# Patient Record
Sex: Male | Born: 1960
Health system: Southern US, Community
[De-identification: ages and names within clinical notes are randomized; demographics above are authoritative.]

## PROBLEM LIST (undated history)

## (undated) DIAGNOSIS — Z91014 Allergy to mammalian meats: Secondary | ICD-10-CM

## (undated) DIAGNOSIS — I10 Essential (primary) hypertension: Secondary | ICD-10-CM

## (undated) DIAGNOSIS — E039 Hypothyroidism, unspecified: Secondary | ICD-10-CM

## (undated) DIAGNOSIS — E079 Disorder of thyroid, unspecified: Secondary | ICD-10-CM

## (undated) HISTORY — PX: HEMORRHOID SURGERY: SHX153

## (undated) HISTORY — PX: HERNIA REPAIR: SHX51

## (undated) HISTORY — PX: OTHER SURGICAL HISTORY: SHX169

## (undated) HISTORY — PX: ABDOMINAL HERNIA REPAIR: SHX539

---

## 2004-08-23 ENCOUNTER — Emergency Department: Payer: Self-pay | Admitting: Emergency Medicine

## 2004-09-10 ENCOUNTER — Ambulatory Visit: Payer: Self-pay | Admitting: Specialist

## 2007-01-23 ENCOUNTER — Ambulatory Visit: Payer: Self-pay | Admitting: Emergency Medicine

## 2007-11-03 ENCOUNTER — Ambulatory Visit: Payer: Self-pay | Admitting: Gastroenterology

## 2008-10-04 ENCOUNTER — Ambulatory Visit: Payer: Self-pay | Admitting: Internal Medicine

## 2008-10-05 ENCOUNTER — Ambulatory Visit: Payer: Self-pay | Admitting: Internal Medicine

## 2008-10-08 ENCOUNTER — Ambulatory Visit: Payer: Self-pay | Admitting: Internal Medicine

## 2009-12-12 ENCOUNTER — Ambulatory Visit: Payer: Self-pay | Admitting: Family Medicine

## 2010-10-16 ENCOUNTER — Ambulatory Visit: Payer: Self-pay | Admitting: Internal Medicine

## 2011-01-26 ENCOUNTER — Ambulatory Visit: Payer: Self-pay

## 2013-02-24 ENCOUNTER — Ambulatory Visit: Payer: Self-pay | Admitting: Medical

## 2013-02-27 LAB — BETA STREP CULTURE(ARMC)

## 2014-09-09 ENCOUNTER — Ambulatory Visit
Admission: EM | Admit: 2014-09-09 | Discharge: 2014-09-09 | Disposition: A | Discharge status: Home / Self Care | Payer: Managed Care, Other (non HMO) | Attending: Family Medicine | Admitting: Family Medicine

## 2014-09-09 ENCOUNTER — Encounter: Payer: Self-pay | Admitting: Family Medicine

## 2014-09-09 DIAGNOSIS — M62830 Muscle spasm of back: Secondary | ICD-10-CM

## 2014-09-09 DIAGNOSIS — Z8679 Personal history of other diseases of the circulatory system: Secondary | ICD-10-CM | POA: Diagnosis not present

## 2014-09-09 DIAGNOSIS — S39012D Strain of muscle, fascia and tendon of lower back, subsequent encounter: Secondary | ICD-10-CM | POA: Diagnosis not present

## 2014-09-09 HISTORY — DX: Disorder of thyroid, unspecified: E07.9

## 2014-09-09 HISTORY — DX: Essential (primary) hypertension: I10

## 2014-09-09 MED ORDER — MELOXICAM 7.5 MG PO TABS: 7.5000 mg | ORAL_TABLET | Freq: Two times a day (BID) | ORAL | Status: DC | PRN

## 2014-09-09 MED ORDER — ORPHENADRINE CITRATE ER 100 MG PO TB12: 100.0000 mg | ORAL_TABLET | Freq: Two times a day (BID) | ORAL | Status: DC | PRN

## 2014-09-09 NOTE — ED Provider Notes (Signed)
CSN: [id number]     Arrival date & time 09/09/14  1525 History   First MD Initiated Contact with Patient 09/09/14 1542     Chief Complaint  Patient presents with  . Back Pain   (Consider location/radiation/quality/duration/timing/severity/associated sxs/prior Treatment) Patient is a 54 y.o. male presenting with back pain. The history is provided by the patient. A language interpreter was used.  Back Pain Location:  Sacro-iliac joint and lumbar spine Quality:  Aching and cramping Radiates to:  Does not radiate Pain severity:  Moderate Pain is:  Same all the time Onset quality:  Sudden Timing:  Constant Progression:  Waxing and waning Chronicity:  Recurrent Context: physical stress and twisting   Relieved by:  Nothing Ineffective treatments:  OTC medications Associated symptoms: no leg pain, no numbness, no tingling and no weakness    he has a history of recurrent back pain. States that wife was loose in the house and his grandchildren encouraged him to try to kill it when he jumped until it he twists his back. Start having pain. Last night after someplace icy hot on his back form he told him he saw some bruising and repeated that this morning she came in to be seen.  Past Medical History  Diagnosis Date  . Thyroid disease   . Hypertension    Past Surgical History  Procedure Laterality Date  . Abdominal hernia repair      2006  . Hemmorhoid     Family History  Problem Relation Age of Onset  . Diabetes Mother   . Cancer Mother   . Cancer Father   . Thyroid disease Sister   . Cancer Sister   . Diabetes Brother   . Thyroid disease Brother    History  Substance Use Topics  . Smoking status: Former Games developermoker  . Smokeless tobacco: Not on file  . Alcohol Use: No    Review of Systems  Musculoskeletal: Positive for back pain.  Skin: Positive for color change.  Neurological: Negative for tingling, weakness and numbness.    Allergies  Aspirin and Sulfa antibiotics  Home  Medications   Prior to Admission medications   Medication Sig Start Date End Date Taking? Authorizing Provider  levothyroxine (SYNTHROID, LEVOTHROID) 100 MCG tablet Take 100 mcg by mouth daily before breakfast.   Yes Historical Provider, MD  metoprolol succinate (TOPROL-XL) 50 MG 24 hr tablet Take 50 mg by mouth daily. Take with or immediately following a meal.   Yes Historical Provider, MD  valsartan (DIOVAN) 160 MG tablet Take 160 mg by mouth daily.   Yes Historical Provider, MD  meloxicam (MOBIC) 7.5 MG tablet Take 1 tablet (7.5 mg total) by mouth 2 (two) times daily as needed for pain. Take w/food would recommend taking it definetly in the evening 09/09/14   Hassan RowanEugene Rome Schlauch, MD  orphenadrine (NORFLEX) 100 MG tablet Take 1 tablet (100 mg total) by mouth 2 (two) times daily as needed for muscle spasms or mild pain. Would recommend taking it w/food and at night 09/09/14   Hassan RowanEugene Suhas Estis, MD   BP 139/88 mmHg  Pulse 74  Temp(Src) 98.7 F (37.1 C) (Tympanic)  Resp 17  Ht 5\' 11"  (1.803 m)  Wt 175 lb (79.379 kg)  BMI 24.42 kg/m2  SpO2 100% Physical Exam  Constitutional: He is oriented to person, place, and time. He appears well-developed and well-nourished.  HENT:  Head: Normocephalic.  Left Ear: External ear normal.  Musculoskeletal: Normal range of motion. He exhibits tenderness. He exhibits  no edema.       Lumbar back: He exhibits tenderness and spasm.       Back:  Neurological: He is alert and oriented to person, place, and time. He displays normal reflexes. No cranial nerve deficit. He exhibits normal muscle tone.  Skin: Skin is warm.  Psychiatric: His behavior is normal. Thought content normal.    ED Course  Procedures (including critical care time) Labs Review Labs Reviewed - No data to display  Imaging Review No results found. Reassure patient and bruising was seen. Will treat him for muscle spasm muscle strain. If his back and his bother him he can come back for x-rays of the lumbar  spine. This is a problem that he states he reinjured himself almost yearly. He declines work note for today and tomorrow. Also he's had a history of GI intolerance to Motrin. Recommend Mobic smaller dose 7-1/2 mg once to twice a day with food intake or Norflex and hopefully will tolerate that much better.  MDM   1. Back strain, subsequent encounter   2. Hx of essential hypertension   3. Muscle spasm of back        Hassan Rowan, MD 09/09/14 [id number]

## 2014-09-09 NOTE — Discharge Instructions (Signed)
Back Pain, Adult Back pain is very common. The pain often gets better over time. The cause of back pain is usually not dangerous. Most people can learn to manage their back pain on their own.  HOME CARE   Stay active. Start with short walks on flat ground if you can. Try to walk farther each day.  Do not sit, drive, or stand in one place for more than 30 minutes. Do not stay in bed.  Do not avoid exercise or work. Activity can help your back heal faster.  Be careful when you bend or lift an object. Bend at your knees, keep the object close to you, and do not twist.  Sleep on a firm mattress. Lie on your side, and bend your knees. If you lie on your back, put a pillow under your knees.  Only take medicines as told by your doctor.  Put ice on the injured area.  Put ice in a plastic bag.  Place a towel between your skin and the bag.  Leave the ice on for 15-20 minutes, 03-04 times a day for the first 2 to 3 days. After that, you can switch between ice and heat packs.  Ask your doctor about back exercises or massage.  Avoid feeling anxious or stressed. Find good ways to deal with stress, such as exercise. GET HELP RIGHT AWAY IF:   Your pain does not go away with rest or medicine.  Your pain does not go away in 1 week.  You have new problems.  You do not feel well.  The pain spreads into your legs.  You cannot control when you poop (bowel movement) or pee (urinate).  Your arms or legs feel weak or lose feeling (numbness).  You feel sick to your stomach (nauseous) or throw up (vomit).  You have belly (abdominal) pain.  You feel like you may pass out (faint). MAKE SURE YOU:   Understand these instructions.  Will watch your condition.  Will get help right away if you are not doing well or get worse. Document Released: 09/09/2007 Document Revised: 06/15/2011 Document Reviewed: 07/25/2013 [facility] Patient Information 2015 [place]. This information is not intended  to replace advice given to you by your health care provider. Make sure you discuss any questions you have with your health care provider.  Muscle Strain A muscle strain (pulled muscle) happens when a muscle is stretched beyond normal length. It happens when a sudden, violent force stretches your muscle too far. Usually, a few of the fibers in your muscle are torn. Muscle strain is common in athletes. Recovery usually takes 1-2 weeks. Complete healing takes 5-6 weeks.  HOME CARE   Follow the PRICE method of treatment to help your injury get better. Do this the first 2-3 days after the injury:  Protect. Protect the muscle to keep it from getting injured again.  Rest. Limit your activity and rest the injured body part.  Ice. Put ice in a plastic bag. Place a towel between your skin and the bag. Then, apply the ice and leave it on from 15-20 minutes each hour. After the third day, switch to moist heat packs.  Compression. Use a splint or elastic bandage on the injured area for comfort. Do not put it on too tightly.  Elevate. Keep the injured body part above the level of your heart.  Only take medicine as told by your doctor.  Warm up before doing exercise to prevent future muscle strains. GET HELP IF:  You have more pain or puffiness (swelling) in the injured area.  You feel numbness, tingling, or notice a loss of strength in the injured area. MAKE SURE YOU:   Understand these instructions.  Will watch your condition.  Will get help right away if you are not doing well or get worse. Document Released: 12/31/2007 Document Revised: 01/11/2013 Document Reviewed: 10/20/2012 [facility] Patient Information 2015 [place], [place]. This information is not intended to replace advice given to you by your health care provider. Make sure you discuss any questions you have with your health care provider.  Muscle Cramps and Spasms Muscle cramps and spasms are when muscles tighten by themselves. They  usually get better within minutes. Muscle cramps are painful. They are usually stronger and last longer than muscle spasms. Muscle spasms may or may not be painful. They can last a few seconds or much longer. HOME CARE  Drink enough fluid to keep your pee (urine) clear or pale yellow.  Massage, stretch, and relax the muscle.  Use a warm towel, heating pad, or warm shower water on tight muscles.  Place ice on the muscle if it is tender or in pain.  Put ice in a plastic bag.  Place a towel between your skin and the bag.  Leave the ice on for 15-20 minutes, 03-04 times a day.  Only take medicine as told by your doctor. GET HELP RIGHT AWAY IF:  Your cramps or spasms get worse, happen more often, or do not get better with time. MAKE SURE YOU:  Understand these instructions.  Will watch your condition.  Will get help right away if you are not doing well or get worse. Document Released: 03/05/2008 Document Revised: 07/18/2012 Document Reviewed: 03/09/2012 [facility] Patient Information 2015 [place], [place]. This information is not intended to replace advice given to you by your health care provider. Make sure you discuss any questions you have with your health care provider.

## 2014-09-09 NOTE — ED Notes (Signed)
States jumped up to kill a wasp yesterday and when jumped, twisted back. Son told patient his back was bruised. None noted

## 2015-10-20 ENCOUNTER — Emergency Department: Payer: Managed Care, Other (non HMO)

## 2015-10-20 ENCOUNTER — Emergency Department
Admission: EM | Admit: 2015-10-20 | Discharge: 2015-10-20 | Disposition: A | Discharge status: Home / Self Care | Payer: Managed Care, Other (non HMO) | Attending: Emergency Medicine | Admitting: Emergency Medicine

## 2015-10-20 DIAGNOSIS — Z87891 Personal history of nicotine dependence: Secondary | ICD-10-CM | POA: Diagnosis not present

## 2015-10-20 DIAGNOSIS — Z77098 Contact with and (suspected) exposure to other hazardous, chiefly nonmedicinal, chemicals: Secondary | ICD-10-CM

## 2015-10-20 DIAGNOSIS — R0600 Dyspnea, unspecified: Secondary | ICD-10-CM | POA: Diagnosis present

## 2015-10-20 DIAGNOSIS — Z79899 Other long term (current) drug therapy: Secondary | ICD-10-CM | POA: Insufficient documentation

## 2015-10-20 DIAGNOSIS — R0989 Other specified symptoms and signs involving the circulatory and respiratory systems: Secondary | ICD-10-CM | POA: Insufficient documentation

## 2015-10-20 DIAGNOSIS — T594X4A Toxic effect of chlorine gas, undetermined, initial encounter: Secondary | ICD-10-CM | POA: Diagnosis not present

## 2015-10-20 DIAGNOSIS — J392 Other diseases of pharynx: Secondary | ICD-10-CM

## 2015-10-20 NOTE — Discharge Instructions (Signed)
You have been seen in the Emergency Department (ED) today for an allergic reaction possibly due to irritating exposure to chlorine tablets or pool chemical.  You have been stable throughout your stay in the Emergency Department.  Please take your home Allegra medication as prescribed and follow up with your doctor as indicated.  You may also take over-the-counter Benadryl around the clock for the next three days according to the dosing instructions on the package.  Return to the Emergency Department (ED) if you experience any worsening or new symptoms that concern you, throat swelling, facial swelling, wheezing, chest pain, trouble breathing, or other new concerns arise.

## 2015-10-20 NOTE — ED Provider Notes (Signed)
[facility] Emergency Department Provider Note  ____________________________________________  Time seen: Approximately 7:23 AM  I have reviewed the triage vital signs and the nursing notes.   HISTORY  Chief Complaint Respiratory Distress    HPI Thomas Dickson is a 55 y.o. male with a history of hypertension. Reports an irritated feeling in the back of his throat.  Last night he woke up about 3 in the morning and felt a scratchy itchy feeling in the back of his throat. In addition, he felt the same on his hands. It should rates this possibly to having touched chlorine tablets which she uses this pool over the last few years. Sometimes has noticed the last couple weeks that his hands feel little scratchy after using the tablets. He does not wear gloves.  He denies any chest pain, shortness of breath, or fevers or cough. He's had a slightly upset stomach, and noticed that this seems to correlate some with being around and touching the chlorine tablets over the last few weeks, but is not truly sure.  He also reports that he noticed his nose is running after mowing the lawn yesterday, is unsure if that might also be causing some irritation.  No lip swelling. No trouble swallowing. Denies any wheezing or trouble breathing. His symptoms have improved, when he woke up at 3 AM this morning he did feel a little short of breath as though his throat was swollen but this is gone away. He does occasionally take Allegra, but has not taken any in the last couple days.  Past Medical History  Diagnosis Date  . Thyroid disease   . Hypertension     There are no active problems to display for this patient.   Past Surgical History  Procedure Laterality Date  . Abdominal hernia repair      2006  . Hemmorhoid      Current Outpatient Rx  Name  Route  Sig  Dispense  Refill  . levothyroxine (SYNTHROID, LEVOTHROID) 75 MCG tablet   Oral   Take 75 mcg by mouth daily before  breakfast.         . metoprolol succinate (TOPROL-XL) 50 MG 24 hr tablet   Oral   Take 50 mg by mouth daily. Take with or immediately following a meal.         . valsartan (DIOVAN) 160 MG tablet   Oral   Take 160 mg by mouth daily.         . meloxicam (MOBIC) 7.5 MG tablet   Oral   Take 1 tablet (7.5 mg total) by mouth 2 (two) times daily as needed for pain. Take w/food would recommend taking it definetly in the evening Patient not taking: Reported on 10/20/2015   30 tablet   1   . orphenadrine (NORFLEX) 100 MG tablet   Oral   Take 1 tablet (100 mg total) by mouth 2 (two) times daily as needed for muscle spasms or mild pain. Would recommend taking it w/food and at night Patient not taking: Reported on 10/20/2015   30 tablet   1     Allergies Aspirin and Sulfa antibiotics  Family History  Problem Relation Age of Onset  . Diabetes Mother   . Cancer Mother   . Cancer Father   . Thyroid disease Sister   . Cancer Sister   . Diabetes Brother   . Thyroid disease Brother     Social History Social History  Substance Use Topics  .  Smoking status: Former Games developermoker  . Smokeless tobacco: None  . Alcohol Use: No    Review of Systems Constitutional: No fever/chills Eyes: No visual changes. ENT: No sore throat. Cardiovascular: Denies chest pain. Respiratory: Denies shortness of breath.See history of present illness Gastrointestinal: No abdominal pain.  No vomiting.  No diarrhea.  No constipation. Genitourinary: Negative for dysuria. Musculoskeletal: Negative for back pain. Skin: Negative for rash. Neurological: Negative for headaches, focal weakness or numbness.  10-point ROS otherwise negative.  ____________________________________________   PHYSICAL EXAM:  VITAL SIGNS: ED Triage Vitals  Enc Vitals Group     BP 10/20/15 0619 152/101 mmHg     Pulse Rate 10/20/15 0619 73     Resp 10/20/15 0619 16     Temp --      Temp src --      SpO2 10/20/15 0619 98 %      Weight 10/20/15 0537 180 lb (81.647 kg)     Height 10/20/15 0537 5\' 10"  (1.778 m)     Head Cir --      Peak Flow --      Pain Score 10/20/15 0537 0     Pain Loc --      Pain Edu? --      Excl. in GC? --    Constitutional: Alert and oriented. Well appearing and in no acute distress.Very pleasant, escorted by his son. Eyes: Conjunctivae are normal. PERRL. EOMI. Head: Atraumatic. Nose: No congestion/rhinnorhea. Mouth/Throat: Mucous membranes are moist.  Oropharynx non-erythematous. The oropharynx is completely patent. Normal voice. No hoarseness. Sitting comfortably, no appearance of shortness of breath. Neck: No stridor.   Cardiovascular: Normal rate, regular rhythm. Grossly normal heart sounds.  Good peripheral circulation. Respiratory: Normal respiratory effort.  No retractions. Lungs CTAB. No wheezing or stridor. Gastrointestinal: Soft and nontender. No distention. No abdominal bruits. No CVA tenderness. Musculoskeletal: No lower extremity tenderness nor edema.  No joint effusions. Neurologic:  Normal speech and language. No gross focal neurologic deficits are appreciated. No gait instability. Skin:  Skin is warm, dry and intact. No rash noted though he does have slightly loose dry skin on his hands, with some slight excoriations. Psychiatric: Mood and affect are normal. Speech and behavior are normal.  ____________________________________________   LABS (all labs ordered are listed, but only abnormal results are displayed)  Labs Reviewed - No data to display ____________________________________________  EKG  ED ECG REPORT I, Rozelle Caudle, the attending physician, personally viewed and interpreted this ECG.  Date: 10/20/2015 EKG Time: 7:30 Rate: 60 Rhythm: normal sinus rhythm QRS Axis: normal Intervals: normal ST/T Wave abnormalities: normal Conduction Disturbances: none Narrative Interpretation:  unremarkable  ____________________________________________  RADIOLOGY  DG Chest 2 View (Final result) Result time: 10/20/15 40:98:[id number]:21:28   Final result by Rad Results In Interface (10/20/15 91:47:[id number]:21:28)   Narrative:   CLINICAL DATA: Patient had exposure to pool chemicals yesterday and woke up today with throat tightening, shortness of breath, dry nose, and throat.  EXAM: CHEST 2 VIEW  COMPARISON: None.  FINDINGS: Mild hyperinflation. Normal heart size and pulmonary vascularity. No focal airspace disease or consolidation in the lungs. No blunting of costophrenic angles. No pneumothorax. Mediastinal contours appear intact. Focal scarring in the lung apices.  IMPRESSION: No active cardiopulmonary disease.   Electronically Signed By: Burman NievesWilliam Stevens M.D. On: 10/20/2015 06:21       ____________________________________________   PROCEDURES  Procedure(s) performed: None  Critical Care performed: No  ____________________________________________   INITIAL IMPRESSION / ASSESSMENT AND PLAN / ED  COURSE  Pertinent labs & imaging results that were available during my care of the patient were reviewed by me and considered in my medical decision making (see chart for details).  Patient presents for an irritated feeling in the back of his throat, also some itching his hands. 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  Overall very reassuring exam. No evidence of acute respiratory compromise, oral edema, or anything to suggest pneumonitis. He is very stable vital signs, he is very amicable and in no distress. On my evaluation demonstrates no acute abnormalities. His EKG is normal, his chest x-ray is clear. I do not believe there is indication for blood testing at this time.  We discussed and the patient will utilize gloves when handling  chlorine tablets in the future, also suggested potentially treating him with some Allegra as he may have postnasal drip causing some of his throat irritation based on the symptoms that started with slight runny nose after cutting his grass.  No chest pain. No symptoms at this time other than some slightly itchy hands.  Return precautions and treatment recommendations and follow-up discussed with the patient who is agreeable with the plan.  ____________________________________________   FINAL CLINICAL IMPRESSION(S) / ED DIAGNOSES  Final diagnoses:  Throat irritation  Chlorine gas exposure      Sharyn Creamer, MD 10/20/15 [phone]

## 2015-10-20 NOTE — ED Notes (Signed)
Patient has been working with pool chemicals and at night when he lays down he feels like his throat is closing up. Patient is currently in no distress but states when he lays down he feels short of breath.

## 2016-11-22 ENCOUNTER — Ambulatory Visit
Admission: EM | Admit: 2016-11-22 | Discharge: 2016-11-22 | Disposition: A | Discharge status: Home / Self Care | Payer: Commercial Managed Care - PPO | Attending: Emergency Medicine | Admitting: Emergency Medicine

## 2016-11-22 DIAGNOSIS — S80861A Insect bite (nonvenomous), right lower leg, initial encounter: Secondary | ICD-10-CM

## 2016-11-22 DIAGNOSIS — S80862A Insect bite (nonvenomous), left lower leg, initial encounter: Secondary | ICD-10-CM | POA: Diagnosis not present

## 2016-11-22 DIAGNOSIS — W57XXXA Bitten or stung by nonvenomous insect and other nonvenomous arthropods, initial encounter: Secondary | ICD-10-CM | POA: Diagnosis not present

## 2016-11-22 DIAGNOSIS — L03818 Cellulitis of other sites: Secondary | ICD-10-CM

## 2016-11-22 MED ORDER — DOXYCYCLINE HYCLATE 100 MG PO CAPS: 100.0000 mg | ORAL_CAPSULE | Freq: Two times a day (BID) | ORAL | 0 refills | Status: AC

## 2016-11-22 MED ORDER — TRIAMCINOLONE ACETONIDE 0.1 % EX CREA: 1.0000 "application " | TOPICAL_CREAM | Freq: Two times a day (BID) | CUTANEOUS | 0 refills | Status: DC

## 2016-11-22 NOTE — ED Provider Notes (Signed)
HPI  SUBJECTIVE:  Thomas Dickson is a 56 y.o. male who presents with 2 it she erythematous swollen areas adjacent to a tick bite. States that he removed 8 non-engorged but attached seed ticks from his lower extremities and groin 2 days ago. States that these "red spots" around the bite in his groin appeared yesterday. He denies pain. No fevers, bodyaches, flulike symptoms, joint aches, headaches. No aggravating or alleviating factors. He has not tried anything for this. Past medical history of hypertension, hypothyroidism, questionable Lyme disease. He states that one of the 2 tests came back positive and he was treated with antibiotics for 7 days. No history of [facility] spotted fever. PMD: [facility].    Past Medical History:  Diagnosis Date  . Hypertension   . Thyroid disease     Past Surgical History:  Procedure Laterality Date  . ABDOMINAL HERNIA REPAIR     2006  . hemmorhoid      Family History  Problem Relation Age of Onset  . Diabetes Mother   . Cancer Mother   . Cancer Father   . Thyroid disease Sister   . Cancer Sister   . Diabetes Brother   . Thyroid disease Brother     Social History  Substance Use Topics  . Smoking status: Former Games developer  . Smokeless tobacco: Never Used  . Alcohol use No    No current facility-administered medications for this encounter.   Current Outpatient Prescriptions:  .  doxycycline (VIBRAMYCIN) 100 MG capsule, Take 1 capsule (100 mg total) by mouth 2 (two) times daily., Disp: 20 capsule, Rfl: 0 .  levothyroxine (SYNTHROID, LEVOTHROID) 75 MCG tablet, Take 75 mcg by mouth daily before breakfast., Disp: , Rfl:  .  metoprolol succinate (TOPROL-XL) 50 MG 24 hr tablet, Take 50 mg by mouth daily. Take with or immediately following a meal., Disp: , Rfl:  .  triamcinolone cream (KENALOG) 0.1 %, Apply 1 application topically 2 (two) times daily., Disp: 30 g, Rfl: 0 .  valsartan (DIOVAN) 160 MG tablet, Take 160 mg by mouth daily.,  Disp: , Rfl:   Allergies  Allergen Reactions  . Aspirin Other (See Comments)    Stomach cramps  . Sulfa Antibiotics Other (See Comments)    Severe urinary retention     ROS  As noted in HPI.   Physical Exam  BP (!) 148/89 (BP Location: Left Arm)   Pulse 74   Temp 98 F (36.7 C) (Oral)   Resp 18   Ht 5\' 11"  (1.803 m)   Wt 183 lb (83 kg)   SpO2 99%   BMI 25.52 kg/m   Constitutional: Well developed, well nourished, no acute distress Eyes:  EOMI, conjunctiva normal bilaterally HENT: Normocephalic, atraumatic,mucus membranes moist Respiratory: Normal inspiratory effort Cardiovascular: Normal rate GI: nondistended skin:  Erythematous, raised papule measuring 2 x 1 cm the patient states was the tick bite. Positive erythematous, blanchable, swollen, tender area of erythema extending laterally measuring 7.5 x 3 cm and 5.5 x 2 cm. See picture for reference.    Musculoskeletal: no deformities Neurologic: Alert & oriented x 3, no focal neuro deficits Psychiatric: Speech and behavior appropriate   ED Course   Medications - No data to display  No orders of the defined types were placed in this encounter.   No results found for this or any previous visit (from the past 24 hour(s)). No results found.  ED Clinical Impression  Tick bite, initial encounter  Cellulitis of other  specified site  ED Assessment/Plan  Marked areas of erythema with a marker for reference.  We'll send home with doxycycline for 10 days to cover a cellulitis. This will also cover [facility] spotted fever but he does not have any signs or symptoms of this at this time. It is too early for lab work given the bite was 2 days ago. States that he has an appointment with his primary care provider in 4 days for routine lab work and will discuss this with her  If Not getting better by then. Also sending home with triamcinolone cream for the itching. Advised Claritin or Zyrtec if the triamcinolone does  not work.  Discussed MDM, plan and followup with patient. Discussed sn/sx that should prompt return to the ED. Patient agrees with plan.   Meds ordered this encounter  Medications  . doxycycline (VIBRAMYCIN) 100 MG capsule    Sig: Take 1 capsule (100 mg total) by mouth 2 (two) times daily.    Dispense:  20 capsule    Refill:  0  . triamcinolone cream (KENALOG) 0.1 %    Sig: Apply 1 application topically 2 (two) times daily.    Dispense:  30 g    Refill:  0    *This clinic note was created using Scientist, clinical (histocompatibility and immunogenetics). Therefore, there may be occasional mistakes despite careful proofreading.  ?   Domenick Gong, MD 11/22/16 [phone]

## 2016-11-22 NOTE — ED Triage Notes (Addendum)
Pt reports pulling 8 ticks off himself on Friday. Saturday awoke with red areas around the bite to left thigh. No pain, but very itchy.  Also has a "red patch" on his anterior left ankle stinging and burning on Thursday but doesn't remember being bit by anything.

## 2017-06-19 ENCOUNTER — Ambulatory Visit
Admission: EM | Admit: 2017-06-19 | Discharge: 2017-06-19 | Disposition: A | Discharge status: Home / Self Care | Payer: Commercial Managed Care - PPO | Attending: Family Medicine | Admitting: Family Medicine

## 2017-06-19 ENCOUNTER — Encounter: Payer: Self-pay | Admitting: Emergency Medicine

## 2017-06-19 ENCOUNTER — Other Ambulatory Visit: Payer: Self-pay

## 2017-06-19 DIAGNOSIS — M5441 Lumbago with sciatica, right side: Secondary | ICD-10-CM | POA: Diagnosis not present

## 2017-06-19 MED ORDER — CYCLOBENZAPRINE HCL 10 MG PO TABS: 10.0000 mg | ORAL_TABLET | Freq: Three times a day (TID) | ORAL | 0 refills | Status: DC | PRN

## 2017-06-19 MED ORDER — PREDNISONE 10 MG (21) PO TBPK: ORAL_TABLET | ORAL | 0 refills | Status: DC

## 2017-06-19 NOTE — ED Triage Notes (Signed)
Patient c/o lower back pain when he bent over to the feed the dog on Monday.  Patient reports pain in his right hip that started on Tuesday.    Patient also states that for the past couple of days he has had difficulty lifting his right foot.

## 2017-06-19 NOTE — Discharge Instructions (Signed)
Medication as prescribed.  Take care  Dr. Keyona Emrich  

## 2017-06-19 NOTE — ED Provider Notes (Signed)
[facility]   CSN: [id number] Arrival date & time: 06/19/17  1417  History   Chief Complaint Chief Complaint  Patient presents with  . Back Pain   HPI  57 year old male presents with back pain.  Patient reports that on Monday he developed low back pain after bending over to feed the dog.  He states that on Tuesday he continued to have low back pain and subsequently developed pain around the right hip and upper thigh.  He is also had pain in his right buttock.  He reports that he has had burning and stinging of his right thigh.  He  has taken ibuprofen without improvement.  Pain is currently 5/10 in severity.  Seems to be worse with sitting and range of motion of the lower leg.  No incontinence or saddle anesthesia.  No other associated symptoms.  No other complaints at this time.  Past Medical History:  Diagnosis Date  . Hypertension   . Thyroid disease    Past Surgical History:  Procedure Laterality Date  . ABDOMINAL HERNIA REPAIR     2006  . hemmorhoid     Home Medications    Prior to Admission medications   Medication Sig Start Date End Date Taking? Authorizing Provider  levothyroxine (SYNTHROID, LEVOTHROID) 75 MCG tablet Take 75 mcg by mouth daily before breakfast.   Yes [provider]  metoprolol succinate (TOPROL-XL) 50 MG 24 hr tablet Take 50 mg by mouth daily. Take with or immediately following a meal.   Yes [provider]  valsartan (DIOVAN) 160 MG tablet Take 160 mg by mouth daily.   Yes [provider]  cyclobenzaprine (FLEXERIL) 10 MG tablet Take 1 tablet (10 mg total) by mouth 3 (three) times daily as needed. 06/19/17   Thomas Dickson, Thomas Volner G, DO  predniSONE (STERAPRED UNI-PAK 21 TAB) 10 MG (21) TBPK tablet Take 6 tabs by mouth daily  for 2 days, then 5 tabs for 2 days, then 4 tabs for 2 days, then 3 tabs for 2 days, 2 tabs for 2 days, then 1 tab by mouth daily for 2 days 06/19/17   Thomas Dickson, Thomas Mckeon G, DO  triamcinolone cream (KENALOG) 0.1 %  Apply 1 application topically 2 (two) times daily. 11/22/16   Thomas Dickson, Ashley, MD    Family History Family History  Problem Relation Age of Onset  . Diabetes Mother   . Cancer Mother   . Cancer Father   . Thyroid disease Sister   . Cancer Sister   . Diabetes Brother   . Thyroid disease Brother     Social History Social History   Tobacco Use  . Smoking status: Former Games developermoker  . Smokeless tobacco: Never Used  Substance Use Topics  . Alcohol use: No  . Drug use: No     Allergies   Aspirin and Sulfa antibiotics   Review of Systems Review of Systems  Constitutional: Negative.   Musculoskeletal: Positive for back pain.       Right hip pain.   Physical Exam Triage Vital Signs ED Triage Vitals  Enc Vitals Group     BP 06/19/17 1450 (!) 148/86     Pulse Rate 06/19/17 1450 63     Resp 06/19/17 1450 16     Temp 06/19/17 1450 98.3 F (36.8 C)     Temp Source 06/19/17 1450 Oral     SpO2 06/19/17 1450 100 %     Weight 06/19/17 1448 178 lb (80.7 kg)  Height 06/19/17 1448 5\' 10"  (1.778 m)     Head Circumference --      Peak Flow --      Pain Score 06/19/17 1448 5     Pain Loc --      Pain Edu? --      Excl. in GC? --    Updated Vital Signs BP (!) 148/86 (BP Location: Right Arm)   Pulse 63   Temp 98.3 F (36.8 C) (Oral)   Resp 16   Ht 5\' 10"  (1.778 m)   Wt 178 lb (80.7 kg)   SpO2 100%   BMI 25.54 kg/m     Physical Exam  Constitutional: He is oriented to person, place, and time. He appears well-developed. No distress.  Cardiovascular: Normal rate and regular rhythm.  Pulmonary/Chest: Effort normal and breath sounds normal.  Musculoskeletal:  Hip: Right Good range of motion in internal rotation and external rotation.  No pain with internal and external rotation. No greater trochanter tenderness. Patient does have tenderness in the groin.  Lumbar spine without discrete areas of tenderness. Negative straight leg raise.  Neurological: He is alert and  oriented to person, place, and time.  Psychiatric: He has a normal mood and affect. His behavior is normal.  Nursing note and vitals reviewed.  UC Treatments / Results  Labs (all labs ordered are listed, but only abnormal results are displayed) Labs Reviewed - No data to display  EKG  EKG Interpretation None       Radiology No results found.  Procedures Procedures (including critical care time)  Medications Ordered in UC Medications - No data to display   Initial Impression / Assessment and Plan / UC Course  I have reviewed the triage vital signs and the nursing notes.  Pertinent labs & imaging results that were available during my care of the patient were reviewed by me and considered in my medical decision making (see chart for details).     57 year old male presents with low back pain.  Treating with prednisone and Flexeril.  Final Clinical Impressions(s) / UC Diagnoses   Final diagnoses:  Acute midline low back pain with right-sided sciatica    ED Discharge Orders        Ordered    predniSONE (STERAPRED UNI-PAK 21 TAB) 10 MG (21) TBPK tablet     06/19/17 1540    cyclobenzaprine (FLEXERIL) 10 MG tablet  3 times daily PRN     06/19/17 1540     Controlled Substance Prescriptions [place] Controlled Substance Registry consulted? Not Applicable   Thomas Sams, DO 06/19/17 1558

## 2018-05-07 DIAGNOSIS — R7302 Impaired glucose tolerance (oral): Secondary | ICD-10-CM | POA: Insufficient documentation

## 2018-05-26 DIAGNOSIS — E781 Pure hyperglyceridemia: Secondary | ICD-10-CM | POA: Diagnosis not present

## 2018-05-26 DIAGNOSIS — E663 Overweight: Secondary | ICD-10-CM | POA: Diagnosis not present

## 2018-05-26 DIAGNOSIS — Z125 Encounter for screening for malignant neoplasm of prostate: Secondary | ICD-10-CM | POA: Diagnosis not present

## 2018-05-26 DIAGNOSIS — Z0000 Encounter for general adult medical examination without abnormal findings: Secondary | ICD-10-CM | POA: Diagnosis not present

## 2018-05-26 DIAGNOSIS — E039 Hypothyroidism, unspecified: Secondary | ICD-10-CM | POA: Diagnosis not present

## 2018-05-26 DIAGNOSIS — Z131 Encounter for screening for diabetes mellitus: Secondary | ICD-10-CM | POA: Diagnosis not present

## 2018-05-26 DIAGNOSIS — I10 Essential (primary) hypertension: Secondary | ICD-10-CM | POA: Diagnosis not present

## 2018-05-26 DIAGNOSIS — Z79899 Other long term (current) drug therapy: Secondary | ICD-10-CM | POA: Diagnosis not present

## 2018-07-14 DIAGNOSIS — E039 Hypothyroidism, unspecified: Secondary | ICD-10-CM | POA: Diagnosis not present

## 2018-07-14 DIAGNOSIS — Z79899 Other long term (current) drug therapy: Secondary | ICD-10-CM | POA: Diagnosis not present

## 2018-10-16 ENCOUNTER — Ambulatory Visit
Admission: EM | Admit: 2018-10-16 | Discharge: 2018-10-16 | Disposition: A | Discharge status: Home / Self Care | Payer: Commercial Managed Care - PPO | Attending: Urgent Care | Admitting: Urgent Care

## 2018-10-16 DIAGNOSIS — S80862A Insect bite (nonvenomous), left lower leg, initial encounter: Secondary | ICD-10-CM | POA: Diagnosis not present

## 2018-10-16 DIAGNOSIS — W57XXXA Bitten or stung by nonvenomous insect and other nonvenomous arthropods, initial encounter: Secondary | ICD-10-CM

## 2018-10-16 MED ORDER — DOXYCYCLINE HYCLATE 100 MG PO CAPS: 100.0000 mg | ORAL_CAPSULE | Freq: Two times a day (BID) | ORAL | 0 refills | Status: DC

## 2018-10-16 NOTE — Discharge Instructions (Signed)
It was very nice seeing you today in clinic. Thank you for entrusting me with your care.   Please utilize the medications that we discussed. Your prescriptions have been called in to your pharmacy.   Monitor for signs and symptoms of infection, which would included increased redness, swelling, streaking, drainage, pain, and the development of a fever. Anything more than what you area having at the time I saw you in clinic needs to be re-evaluated by your PCP.   Make arrangements to follow up with your regular doctor in 1 week for re-evaluation if not improving. If your symptoms/condition worsens, please seek follow up care either here or in the ER. Please remember, our [place] providers are "right here with you" when you need [place].   Again, it was my pleasure to take care of you today. Thank you for choosing our clinic. I hope that you start to feel better quickly.   Honor Loh, MSN, APRN, FNP-C, CEN Advanced Practice Provider [facility]

## 2018-10-16 NOTE — ED Triage Notes (Signed)
Pt bit by tick on June 26th on the inside of his left leg and rash is red, sore and getting bigger. No fever.

## 2018-10-16 NOTE — ED Provider Notes (Signed)
[place], Thomas Dickson   Name: Thomas Dickson DOB: 01/23/1961 MRN: [id number] CSN: [id number] PCP: [facility]  Arrival date and time:  10/16/18 1456  Chief Complaint:  Rash   NOTE: Prior to seeing the patient today, I have reviewed the triage nursing documentation and vital signs. Clinical staff has updated patient's PMH/PSHx, current medication list, and drug allergies/intolerances to ensure comprehensive history available to assist in medical decision making.   History:   HPI: Thomas Dickson is a 58 y.o. male who presents today with complaints of being bitten by a tick. Patient notes that he removed a tick from his LEFT lateral calf on 09/30/2018. He presents to clinic with the ticks that have bitten him this year taped to a piece of paper documenting the date, time, and location from which the insect was removed. The tick is identified as being a deer tick. Patient has removed 3 ticks this year so far, and has documentation of over 20 ticks being removed from his body last year. He is unsure how long the tick was embedded. The tick was was not noted to be engorged at the time it was removed by the patient. Inspection of the insect in clinic reveals that it was removed fully intact.   Since the insect was removed from the patient, he notes that he has experienced some of the common symptoms associated with known tick borne illnesses, inguinal LAD, fatigue, and non-specific headaches. Patient denies myalgias, arthralgias, weakness, and fevers. He has appreciated an associated rash at the site where he was bitten. While the rash has gotten larger since it first declared, the patient notes that it remains localized to the area of the bite. Rash described as bring 'sore" and has a "burning" quality.   In efforts to conservatively manage his symptoms at home, the patient notes that he has used ibuprofen, which has helped to improve his symptoms.   Past Medical History:  Diagnosis  Date  . Hypertension   . Thyroid disease     Past Surgical History:  Procedure Laterality Date  . ABDOMINAL HERNIA REPAIR     2006  . hemmorhoid      Family History  Problem Relation Age of Onset  . Diabetes Mother   . Cancer Mother   . Cancer Father   . Thyroid disease Sister   . Cancer Sister   . Diabetes Brother   . Thyroid disease Brother     Social History   Tobacco Use  . Smoking status: Former Games developermoker  . Smokeless tobacco: Never Used  Substance Use Topics  . Alcohol use: No  . Drug use: No    There are no active problems to display for this patient.   Home Medications:    Current Meds  Medication Sig  . levothyroxine (SYNTHROID, LEVOTHROID) 75 MCG tablet Take 75 mcg by mouth daily before breakfast.  . metoprolol succinate (TOPROL-XL) 50 MG 24 hr tablet Take 50 mg by mouth daily. Take with or immediately following a meal.  . valsartan (DIOVAN) 160 MG tablet Take 160 mg by mouth daily.    Allergies:   Aspirin and Sulfa antibiotics  Review of Systems (ROS): Review of Systems  Constitutional: Positive for fatigue. Negative for chills and fever.  HENT: Negative.   Respiratory: Negative for cough and shortness of breath.   Cardiovascular: Negative for chest pain and palpitations.  Gastrointestinal: Negative for abdominal pain, diarrhea, nausea and vomiting.  Musculoskeletal: Negative for arthralgias, back pain, joint swelling,  myalgias, neck pain and neck stiffness.  Skin: Positive for color change and rash.  Neurological: Positive for headaches.  Hematological: Positive for adenopathy.     Vital Signs: Today's Vitals   10/16/18 1511 10/16/18 1512 10/16/18 1514 10/16/18 1529  BP:  123/71    Pulse:  72    Resp:  18    Temp:  98.1 F (36.7 C)    TempSrc: Oral Oral    SpO2:  99%    Weight:   185 lb (83.9 kg)   PainSc:   2  0-No pain    Physical Exam: Physical Exam  Constitutional: He is oriented to person, place, and time and well-developed,  well-nourished, and in no distress.  HENT:  Head: Normocephalic and atraumatic.  Nose: Nose normal.  Mouth/Throat: Oropharynx is clear and moist and mucous membranes are normal.  Eyes: Pupils are equal, round, and reactive to light. EOM are normal.  Neck: Normal range of motion. Neck supple. No tracheal deviation present.  Cardiovascular: Normal rate, regular rhythm, normal heart sounds and intact distal pulses. Exam reveals no gallop and no friction rub.  No murmur heard. Pulmonary/Chest: Effort normal and breath sounds normal. No respiratory distress. He has no wheezes. He has no rales.  Abdominal: Soft. Bowel sounds are normal. He exhibits no distension. There is no abdominal tenderness.  Lymphadenopathy:    He has no cervical adenopathy.       Left: Inguinal (shoddy; tender) adenopathy present.  Neurological: He is alert and oriented to person, place, and time. Gait normal. GCS score is 15.  Skin: Skin is warm and dry. Rash noted.     Psychiatric: Mood, memory, affect and judgment normal.  Nursing note and vitals reviewed.   Urgent Care Treatments / Results:   LABS: PLEASE NOTE: all labs that were ordered this encounter are listed, however only abnormal results are displayed. Labs Reviewed - No data to display  EKG: -None  RADIOLOGY: No results found.  PROCEDURES: Procedures  MEDICATIONS RECEIVED THIS VISIT: Medications - No data to display  PERTINENT CLINICAL COURSE NOTES/UPDATES:   Initial Impression / Assessment and Plan / Urgent Care Course:  Pertinent labs & imaging results that were available during my care of the patient were personally reviewed by me and considered in my medical decision making (see lab/imaging section of note for values and interpretations).  Thomas Dickson is a 58 y.o. male who presents to [facility] today with complaints of Rash  Patient overall well appearing and in no acute distress today in clinic. Exam reveals 4 cm annular  rash consistent with erythema migrans typically seen with tick bites. He has had some shoddy inguinal adenopathy on the LEFT. No fevers. (+) fatigue and generalized headaches. He is eating and drinking well. No abdominal pain, nausea, or vomiting. Given progression of rash 2 weeks after inciting injury/bite, coupled with other accompanying symptoms, there is concern for tick borne infection. Labs will not change management at this point. Will proceed with treatment using a 10 day course of doxycycline. Patient encouraged to use APAP/IBU as needed for discomfort and headaches. Cool compresses may help to soothe skin in the area of his rash. Discussed tick bite prevention using OTC products such as DEET and [employer] Skin So Soft.   Discussed follow up with primary care physician in 1 week for re-evaluation. I have reviewed the follow up and strict return precautions for any new or worsening symptoms. Patient is aware of symptoms that would be  deemed urgent/emergent, and would thus require further evaluation either here or in the emergency department. At the time of discharge, he verbalized understanding and consent with the discharge plan as it was reviewed with him. All questions were fielded by provider and/or clinic staff prior to patient discharge.    Final Clinical Impressions / Urgent Care Diagnoses:   Final diagnoses:  Tick bite of calf, left, initial encounter    New Prescriptions:  [place] Controlled Substance Registry consulted? Not Applicable  Meds ordered this encounter  Medications  . doxycycline (VIBRAMYCIN) 100 MG capsule    Sig: Take 1 capsule (100 mg total) by mouth 2 (two) times daily.    Dispense:  20 capsule    Refill:  0    Recommended Follow up Care:  Patient encouraged to follow up with the following provider within the specified time frame, or sooner as dictated by the severity of his symptoms. As always, he was instructed that for any urgent/emergent care needs, he should seek  care either here or in the emergency department for more immediate evaluation.  Follow-up Information    [facility], [facility] In 1 week.   Why: General reassessment of symptoms if not improving Contact information: [address] [place] [phone]         NOTE: This note was prepared using Dragon dictation software along with smaller phrase technology. Despite my best ability to proofread, there is the potential that transcriptional errors may still occur from this process, and are completely unintentional.     Verlee MonteGray, Renalda Locklin E, NP 10/17/18 [phone]

## 2020-03-21 DIAGNOSIS — U071 COVID-19: Secondary | ICD-10-CM

## 2020-03-21 HISTORY — DX: COVID-19: U07.1

## 2020-03-24 ENCOUNTER — Other Ambulatory Visit: Payer: Self-pay

## 2020-03-24 ENCOUNTER — Emergency Department: Payer: Commercial Managed Care - PPO

## 2020-03-24 ENCOUNTER — Emergency Department
Admission: EM | Admit: 2020-03-24 | Discharge: 2020-03-24 | Disposition: A | Discharge status: Home / Self Care | Payer: Commercial Managed Care - PPO | Attending: Emergency Medicine | Admitting: Emergency Medicine

## 2020-03-24 ENCOUNTER — Encounter: Payer: Self-pay | Admitting: Emergency Medicine

## 2020-03-24 DIAGNOSIS — Z87891 Personal history of nicotine dependence: Secondary | ICD-10-CM | POA: Diagnosis not present

## 2020-03-24 DIAGNOSIS — E039 Hypothyroidism, unspecified: Secondary | ICD-10-CM | POA: Insufficient documentation

## 2020-03-24 DIAGNOSIS — Z79899 Other long term (current) drug therapy: Secondary | ICD-10-CM | POA: Diagnosis not present

## 2020-03-24 DIAGNOSIS — U071 COVID-19: Secondary | ICD-10-CM | POA: Insufficient documentation

## 2020-03-24 DIAGNOSIS — I10 Essential (primary) hypertension: Secondary | ICD-10-CM | POA: Diagnosis not present

## 2020-03-24 DIAGNOSIS — R197 Diarrhea, unspecified: Secondary | ICD-10-CM | POA: Diagnosis present

## 2020-03-24 DIAGNOSIS — E860 Dehydration: Secondary | ICD-10-CM

## 2020-03-24 LAB — CBC
HCT: 45.6 % (ref 39.0–52.0)
Hemoglobin: 15.6 g/dL (ref 13.0–17.0)
MCH: 28.9 pg (ref 26.0–34.0)
MCHC: 34.2 g/dL (ref 30.0–36.0)
MCV: 84.4 fL (ref 80.0–100.0)
Platelets: 131 10*3/uL — ABNORMAL LOW (ref 150–400)
RBC: 5.4 MIL/uL (ref 4.22–5.81)
RDW: 14.4 % (ref 11.5–15.5)
WBC: 5.1 10*3/uL (ref 4.0–10.5)
nRBC: 0 % (ref 0.0–0.2)

## 2020-03-24 LAB — BASIC METABOLIC PANEL
Anion gap: 12 (ref 5–15)
BUN: 13 mg/dL (ref 8–23)
CO2: 21 mmol/L — ABNORMAL LOW (ref 22–32)
Calcium: 8.4 mg/dL — ABNORMAL LOW (ref 8.9–10.3)
Chloride: 99 mmol/L (ref 98–111)
Creatinine, Ser: 1.22 mg/dL (ref 0.61–1.24)
GFR, Estimated: >60 mL/min (ref >60)
Glucose, Bld: 116 mg/dL — ABNORMAL HIGH (ref 70–99)
Potassium: 3.5 mmol/L (ref 3.5–5.1)
Sodium: 132 mmol/L — ABNORMAL LOW (ref 135–145)

## 2020-03-24 LAB — TROPONIN I (HIGH SENSITIVITY)
Troponin I (High Sensitivity): 12 ng/L (ref <18)
Troponin I (High Sensitivity): 10 ng/L (ref <18)

## 2020-03-24 MED ORDER — KETOROLAC TROMETHAMINE 30 MG/ML IJ SOLN
15.0000 mg | Freq: Once | INTRAMUSCULAR | Status: AC
  Administered 2020-03-24: 15 mg via INTRAVENOUS
  Filled 2020-03-24: qty 1

## 2020-03-24 MED ORDER — ACETAMINOPHEN 500 MG PO TABS
1000.0000 mg | ORAL_TABLET | Freq: Once | ORAL | Status: AC
  Administered 2020-03-24: 1000 mg via ORAL
  Filled 2020-03-24: qty 2

## 2020-03-24 MED ORDER — LACTATED RINGERS IV BOLUS
1000.0000 mL | Freq: Once | INTRAVENOUS | Status: AC
  Administered 2020-03-24: 1000 mL via INTRAVENOUS

## 2020-03-24 NOTE — ED Notes (Signed)
Ambulated in room; no c/o SOB. Sats never dropped below 98%. Provider aware.

## 2020-03-24 NOTE — ED Triage Notes (Signed)
Pt reports was dx'd with covid on Thursday and has some CP, diarrhea and feeling weak.

## 2020-03-24 NOTE — ED Provider Notes (Signed)
[facility] Emergency Department Provider Note ____________________________________________   Event Date/Time   First MD Initiated Contact with Patient 03/24/20 1233     (approximate)  I have reviewed the triage vital signs and the nursing notes.  HISTORY  Chief Complaint Diarrhea, Chest Pain, and Weakness   HPI Thomas Dickson is a 59 y.o. malewho presents to the ED for evaluation of generalized weakness, chest pain and diarrhea.  Chart review indicates history of hypertension and hypothyroidism. 12/16 urgent care visit, reporting 5 days of symptoms at that time, subsequently testing positive for COVID-19.  Patient presents to the ED based off of recommendations from his urgent care visit return to the ED with any diarrhea or fevers.  Patient reports continued shortness of breath, dyspnea on exertion, nonproductive cough and watery diarrhea.  He reports about 4-5 episodes of watery diarrhea per day without hematochezia or melena.  He denies abdominal pain or emesis, denies syncope, chest pressure.  He reports he still able to tolerate p.o. intake, "but everything I drink just comes right out."  He reports continued fevers of 101 F at home, resolved with Tylenol.   Past Medical History:  Diagnosis Date  . Hypertension   . Thyroid disease     There are no problems to display for this patient.   Past Surgical History:  Procedure Laterality Date  . ABDOMINAL HERNIA REPAIR     2006  . hemmorhoid      Prior to Admission medications   Medication Sig Start Date End Date Taking? Authorizing Provider  doxycycline (VIBRAMYCIN) 100 MG capsule Take 1 capsule (100 mg total) by mouth 2 (two) times daily. 10/16/18   Verlee Monte, NP  levothyroxine (SYNTHROID, LEVOTHROID) 75 MCG tablet Take 75 mcg by mouth daily before breakfast.    [provider]  metoprolol succinate (TOPROL-XL) 50 MG 24 hr tablet Take 50 mg by mouth daily. Take with or  immediately following a meal.    [provider]  valsartan (DIOVAN) 160 MG tablet Take 160 mg by mouth daily.    [provider]    Allergies Aspirin and Sulfa antibiotics  Family History  Problem Relation Age of Onset  . Diabetes Mother   . Cancer Mother   . Cancer Father   . Thyroid disease Sister   . Cancer Sister   . Diabetes Brother   . Thyroid disease Brother     Social History Social History   Tobacco Use  . Smoking status: Former Games developer  . Smokeless tobacco: Never Used  Vaping Use  . Vaping Use: Never used  Substance Use Topics  . Alcohol use: No  . Drug use: No    Review of Systems  Constitutional: Positive for fevers and chills Eyes: No visual changes. ENT: No sore throat. Cardiovascular: Denies chest pain. Respiratory: Positive shortness of breath nonproductive cough. Gastrointestinal: No abdominal pain.  No nausea, no vomiting. No constipation. Positive diarrhea Genitourinary: Negative for dysuria. Musculoskeletal: Negative for back pain. Skin: Negative for rash. Neurological: Negative for headaches, focal weakness or numbness.  ____________________________________________   PHYSICAL EXAM:  VITAL SIGNS: Vitals:   03/24/20 1122  BP: (!) 118/92  Pulse: 97  Resp: 20  Temp: 98.2 F (36.8 C)  SpO2: 99%      Constitutional: Alert and oriented. Well appearing and in no acute distress. Eyes: Conjunctivae are normal. PERRL. EOMI. Head: Atraumatic. Nose: No congestion/rhinnorhea. Mouth/Throat: Mucous membranes are moist.  Oropharynx non-erythematous. Neck: No stridor. No cervical  spine tenderness to palpation. Cardiovascular: Normal rate, regular rhythm. Grossly normal heart sounds.  Good peripheral circulation. Respiratory: Normal respiratory effort.  No retractions. Lungs CTAB. Gastrointestinal: Soft , nondistended, nontender to palpation. No CVA tenderness. Musculoskeletal: No lower extremity tenderness nor edema.  No  joint effusions. No signs of acute trauma. Neurologic:  Normal speech and language. No gross focal neurologic deficits are appreciated. No gait instability noted. Skin:  Skin is warm, dry and intact. No rash noted. Psychiatric: Mood and affect are normal. Speech and behavior are normal.  ____________________________________________   LABS (all labs ordered are listed, but only abnormal results are displayed)  Labs Reviewed  BASIC METABOLIC PANEL - Abnormal; Notable for the following components:      Result Value   Sodium 132 (*)    CO2 21 (*)    Glucose, Bld 116 (*)    Calcium 8.4 (*)    All other components within normal limits  CBC - Abnormal; Notable for the following components:   Platelets 131 (*)    All other components within normal limits  TROPONIN I (HIGH SENSITIVITY)  TROPONIN I (HIGH SENSITIVITY)   ____________________________________________  12 Lead EKG  91 bpm.  Normal axis.  Normal intervals.  No evidence of acute ischemia. ____________________________________________  RADIOLOGY  ED MD interpretation: 2 view CXR with patchy multifocal opacities consistent with COVID-19 without evidence of discrete lobar filtration to suggest bacterial pneumonia.  Official radiology report(s): DG Chest Portable 1 View  Result Date: 03/24/2020 CLINICAL DATA:  COVID positive, worsening chest pain EXAM: PORTABLE CHEST 1 VIEW COMPARISON:  10/20/2015 chest radiograph. FINDINGS: Stable cardiomediastinal silhouette with normal heart size. No pneumothorax. No pleural effusion. New faint hazy patchy opacity in the lower lungs, right greater than left. Mild biapical pleural-parenchymal scarring, right greater than left, chronic and stable. No pulmonary edema. IMPRESSION: New faint hazy patchy opacity in the lower lungs, right greater than left, suggesting COVID-19 pneumonia. Electronically Signed   By: Delbert Phenix M.D.   On: 03/24/2020 13:43     ____________________________________________   PROCEDURES and INTERVENTIONS  Procedure(s) performed (including Critical Care):  .1-3 Lead EKG Interpretation Performed by: Delton Prairie, MD Authorized by: Delton Prairie, MD     Interpretation: normal     ECG rate:  90   ECG rate assessment: normal     Rhythm: sinus rhythm     Ectopy: none     Conduction: normal      Medications  ketorolac (TORADOL) 30 MG/ML injection 15 mg (has no administration in time range)  acetaminophen (TYLENOL) tablet 1,000 mg (1,000 mg Oral Given 03/24/20 1307)  lactated ringers bolus 1,000 mL (1,000 mLs Intravenous New Bag/Given 03/24/20 1308)    ____________________________________________   MDM / ED COURSE  59 year old unvaccinated male presents to the ED with continued shortness of breath and watery diarrhea in the setting of COVID-19, amenable to rehydration and outpatient management.  Normal vitals on room air.  Exam with some stigmata of dehydration, but largely unremarkable.  He looks well without distress.  Blood work with slight decrease in his CO2, suggestive of dehydration.  CXR shows expected opacities in setting of COVID-19 without evidence of superimposed bacterial pneumonia.  EKG is nonischemic.  Patient looks well and reports improving symptoms after rehydration.  We will ambulate with pulse oximetry to ensure no hypoxia with ambulation and anticipate outpatient management thereafter.  We discussed return precautions for the ED and outpatient management.    ____________________________________________   FINAL CLINICAL IMPRESSION(S) /  ED DIAGNOSES  Final diagnoses:  COVID-19  Dehydration  Diarrhea, unspecified type     ED Discharge Orders    None       Raelin Pixler   Note:  This document was prepared using Dragon voice recognition software and may include unintentional dictation errors.   Delton Prairie, MD 03/24/20 [phone]

## 2020-03-24 NOTE — Discharge Instructions (Addendum)
Please take Tylenol and ibuprofen/Advil for your pain.  It is safe to take them together, or to alternate them every few hours.  Take up to 1000mg  of Tylenol at a time, up to 4 times per day.  Do not take more than 4000 mg of Tylenol in 24 hours.  For ibuprofen, take 400-600 mg, 4-5 times per day.  Push fluids to maintain hydration.  It may be helpful to get something like Gatorade that has electrolytes and sugar in it if you are not eating very much solid food.  Like we talked about, if you have fevers that will not go away with Tylenol or Advil, passing out, cough productive of sputum that could represent a bacterial pneumonia, please return to the ED.

## 2020-03-30 ENCOUNTER — Emergency Department: Payer: Commercial Managed Care - PPO

## 2020-03-30 ENCOUNTER — Other Ambulatory Visit: Payer: Self-pay

## 2020-03-30 ENCOUNTER — Emergency Department
Admission: EM | Admit: 2020-03-30 | Discharge: 2020-03-30 | Disposition: A | Discharge status: Home / Self Care | Payer: Commercial Managed Care - PPO | Attending: Emergency Medicine | Admitting: Emergency Medicine

## 2020-03-30 DIAGNOSIS — R1310 Dysphagia, unspecified: Secondary | ICD-10-CM | POA: Diagnosis present

## 2020-03-30 DIAGNOSIS — U071 COVID-19: Secondary | ICD-10-CM | POA: Diagnosis not present

## 2020-03-30 DIAGNOSIS — Z79899 Other long term (current) drug therapy: Secondary | ICD-10-CM | POA: Diagnosis not present

## 2020-03-30 DIAGNOSIS — Z87891 Personal history of nicotine dependence: Secondary | ICD-10-CM | POA: Insufficient documentation

## 2020-03-30 DIAGNOSIS — R070 Pain in throat: Secondary | ICD-10-CM | POA: Insufficient documentation

## 2020-03-30 DIAGNOSIS — I10 Essential (primary) hypertension: Secondary | ICD-10-CM | POA: Diagnosis not present

## 2020-03-30 LAB — CBC WITH DIFFERENTIAL/PLATELET
Abs Immature Granulocytes: 0.05 10*3/uL (ref 0.00–0.07)
Basophils Absolute: 0 10*3/uL (ref 0.0–0.1)
Basophils Relative: 0 %
Eosinophils Absolute: 0 10*3/uL (ref 0.0–0.5)
Eosinophils Relative: 0 %
HCT: 45.1 % (ref 39.0–52.0)
Hemoglobin: 15.6 g/dL (ref 13.0–17.0)
Immature Granulocytes: 1 %
Lymphocytes Relative: 15 %
Lymphs Abs: 1 10*3/uL (ref 0.7–4.0)
MCH: 28.4 pg (ref 26.0–34.0)
MCHC: 34.6 g/dL (ref 30.0–36.0)
MCV: 82 fL (ref 80.0–100.0)
Monocytes Absolute: 0.6 10*3/uL (ref 0.1–1.0)
Monocytes Relative: 9 %
Neutro Abs: 5.3 10*3/uL (ref 1.7–7.7)
Neutrophils Relative %: 75 %
Platelets: 252 10*3/uL (ref 150–400)
RBC: 5.5 MIL/uL (ref 4.22–5.81)
RDW: 14.6 % (ref 11.5–15.5)
WBC: 7.1 10*3/uL (ref 4.0–10.5)
nRBC: 0 % (ref 0.0–0.2)

## 2020-03-30 LAB — BASIC METABOLIC PANEL
Anion gap: 11 (ref 5–15)
BUN: 8 mg/dL (ref 8–23)
CO2: 22 mmol/L (ref 22–32)
Calcium: 8.7 mg/dL — ABNORMAL LOW (ref 8.9–10.3)
Chloride: 102 mmol/L (ref 98–111)
Creatinine, Ser: 1.16 mg/dL (ref 0.61–1.24)
GFR, Estimated: >60 mL/min (ref >60)
Glucose, Bld: 155 mg/dL — ABNORMAL HIGH (ref 70–99)
Potassium: 3.5 mmol/L (ref 3.5–5.1)
Sodium: 135 mmol/L (ref 135–145)

## 2020-03-30 LAB — TROPONIN I (HIGH SENSITIVITY)
Troponin I (High Sensitivity): 14 ng/L (ref <18)
Troponin I (High Sensitivity): 11 ng/L (ref <18)

## 2020-03-30 NOTE — ED Triage Notes (Signed)
Pt states he ate a hot pocket at 1900 and he felt "something get hung in my throat". Pt is able to swallow. Pt is on day 14 of positive covid test.

## 2020-03-30 NOTE — ED Provider Notes (Signed)
[facility] Emergency Department Provider Note  ____________________________________________   Event Date/Time   First MD Initiated Contact with Patient 03/30/20 [phone]     (approximate)  I have reviewed the triage vital signs and the nursing notes.   HISTORY  Chief Complaint throat pain   HPI Thomas Dickson is a 59 y.o. male with a past medical history of thyroidism and hypertension as well as recent diagnosis of COVID-19 approximately 2 weeks ago who presents for assessment of some difficulty swallowing and sore throat that he states began when he was initially diagnosed with Covid.  He states that tonight he attempted to eat a hot pocket but felt like it got stuck in his throat.  He attempted to drink some water and was able to keep the entire bottle of water down but felt like he was gagging a little bit.  He has not had any recent vomiting and otherwise has not had any chest pain, cough, fevers, abdominal pain, back pain, burning with urination, diarrhea, rash or extremity pain the last couple days.  States he has been using mostly chicken soup and has been taking chewable ibuprofen and Tylenol although he is not currently hurting anywhere.         Past Medical History:  Diagnosis Date  . Hypertension   . Thyroid disease     There are no problems to display for this patient.   Past Surgical History:  Procedure Laterality Date  . ABDOMINAL HERNIA REPAIR     2006  . hemmorhoid      Prior to Admission medications   Medication Sig Start Date End Date Taking? Authorizing Provider  levothyroxine (SYNTHROID, LEVOTHROID) 75 MCG tablet Take 75 mcg by mouth daily before breakfast.    [provider]  metoprolol succinate (TOPROL-XL) 50 MG 24 hr tablet Take 50 mg by mouth daily. Take with or immediately following a meal.    [provider]  valsartan (DIOVAN) 160 MG tablet Take 160 mg by mouth daily.    [provider]     Allergies Aspirin and Sulfa antibiotics  Family History  Problem Relation Age of Onset  . Diabetes Mother   . Cancer Mother   . Cancer Father   . Thyroid disease Sister   . Cancer Sister   . Diabetes Brother   . Thyroid disease Brother     Social History Social History   Tobacco Use  . Smoking status: Former Games developer  . Smokeless tobacco: Never Used  Vaping Use  . Vaping Use: Never used  Substance Use Topics  . Alcohol use: No  . Drug use: No    Review of Systems  Review of Systems  Constitutional: Negative for chills and fever.  HENT: Positive for sore throat.   Eyes: Negative for pain.  Respiratory: Negative for cough and stridor.   Cardiovascular: Negative for chest pain.  Gastrointestinal: Negative for vomiting.  Genitourinary: Negative for dysuria.  Musculoskeletal: Negative for myalgias.  Skin: Negative for rash.  Neurological: Negative for seizures, loss of consciousness and headaches.  Psychiatric/Behavioral: Negative for suicidal ideas.  All other systems reviewed and are negative.     ____________________________________________   PHYSICAL EXAM:  VITAL SIGNS: ED Triage Vitals  Enc Vitals Group     BP 03/30/20 0344 (!) 160/99     Pulse Rate 03/30/20 0344 95     Resp 03/30/20 0344 18     Temp 03/30/20 0344 99.2 F (37.3 C)  Temp Source 03/30/20 0344 Oral     SpO2 03/30/20 0344 96 %     Weight 03/30/20 0340 185 lb (83.9 kg)     Height 03/30/20 0340 5\' 10"  (1.778 m)     Head Circumference --      Peak Flow --      Pain Score 03/30/20 0339 0     Pain Loc --      Pain Edu? --      Excl. in GC? --    Vitals:   03/30/20 0344  BP: (!) 160/99  Pulse: 95  Resp: 18  Temp: 99.2 F (37.3 C)  SpO2: 96%   Physical Exam Vitals and nursing note reviewed.  Constitutional:      Appearance: He is well-developed and well-nourished.  HENT:     Head: Normocephalic and atraumatic.     Right Ear: External ear normal.     Left Ear: External  ear normal.     Nose: Nose normal.  Eyes:     Conjunctiva/sclera: Conjunctivae normal.  Cardiovascular:     Rate and Rhythm: Normal rate and regular rhythm.     Heart sounds: No murmur heard.   Pulmonary:     Effort: Pulmonary effort is normal. No respiratory distress.     Breath sounds: Normal breath sounds.  Abdominal:     Palpations: Abdomen is soft.     Tenderness: There is no abdominal tenderness.  Musculoskeletal:        General: No edema.     Cervical back: Neck supple.  Skin:    General: Skin is warm and dry.  Neurological:     Mental Status: He is alert.  Psychiatric:        Mood and Affect: Mood and affect normal.     Cranial nerves II through XII grossly intact.  Oropharynx unremarkable.  No stridor over the neck.  Patient is not pulling secretions and lungs are clear bilaterally.  Patient is all extremities with symmetric strength.  Sensation intact light touch all extremities. ____________________________________________   LABS (all labs ordered are listed, but only abnormal results are displayed)  Labs Reviewed  BASIC METABOLIC PANEL - Abnormal; Notable for the following components:      Result Value   Glucose, Bld 155 (*)    Calcium 8.7 (*)    All other components within normal limits  CBC WITH DIFFERENTIAL/PLATELET  TROPONIN I (HIGH SENSITIVITY)  TROPONIN I (HIGH SENSITIVITY)   ____________________________________________  EKG  Sinus rhythm with a ventricular of 92, normal axis, unremarkable intervals, isolated nonspecific ST change in V3 without any clear evidence otherwise of acute ischemia or other significant underlying arrhythmia ____________________________________________  RADIOLOGY  ED MD interpretation: Unremarkable neck x-ray without significant prevertebral edema or retained foreign body visible.  Chest x-ray shows improving haziness compared to prior with no other acute intrathoracic abnormalities.  Official radiology report(s): DG  Neck Soft Tissue  Result Date: 03/30/2020 CLINICAL DATA:  Initial evaluation for dysphagia, possible retained foreign body. EXAM: NECK SOFT TISSUES - 1+ VIEW COMPARISON:  None. FINDINGS: There is no evidence of retropharyngeal soft tissue swelling or epiglottic enlargement. The cervical airway is unremarkable and no radio-opaque foreign body identified. IMPRESSION: Negative.  No radiopaque foreign body identified. Electronically Signed   By: 04/01/2020 M.D.   On: 03/30/2020 05:29   DG Chest 2 View  Result Date: 03/30/2020 CLINICAL DATA:  Initial evaluation for acute dysphagia. EXAM: CHEST - 2 VIEW COMPARISON:  Prior radiograph from 03/24/2020.  FINDINGS: Transverse heart size stable, and remains within normal limits. Mediastinal silhouette within normal limits. No visible radiopaque foreign body along the visualized aero digestive tract. Lungs well inflated. Interval improvement in previously seen hazy bibasilar opacities, right greater than left, suggesting improved viral pneumonitis. Irregular pleuroparenchymal scarring noted at the right lung apex no other new focal airspace disease. No edema or effusion. No pneumothorax. No acute osseous finding. IMPRESSION: 1. Interval improvement in previously seen hazy bibasilar opacities, right greater than left, suggesting improved viral pneumonitis. 2. No other active cardiopulmonary disease. 3. No visible radiopaque foreign body along the aero digestive tract. Electronically Signed   By: Rise Mu M.D.   On: 03/30/2020 05:28    ____________________________________________   PROCEDURES  Procedure(s) performed (including Critical Care):  .1-3 Lead EKG Interpretation Performed by: Gilles Chiquito, MD Authorized by: Gilles Chiquito, MD     Interpretation: normal     ECG rate assessment: normal     Rhythm: sinus rhythm     Ectopy: none     Conduction: normal       ____________________________________________   INITIAL  IMPRESSION / ASSESSMENT AND PLAN / ED COURSE      Patient presents for assessment of some difficulty swallowing over the last 2 weeks as well as sensation of something being stuck in his throat after he attempted to eat a hot pocket.  This evening.  Has been able to tolerate bottle of water and has not subsequently been pulling his secretions and has not vomited.  In addition he has not had any chest pain but does endorse little bit of a sore throat.  He is afebrile and hemodynamically stable arrival this is hypertensive with a BP of 160/99.  Differential includes ACS, edema versus inflammation from recent COVID-19 infection, CVA, retropharyngeal abscess/edema, achalasia, esophageal spasm, esophageal web, Schatzki ring, strictures, and Zenker's diverticulum.  No neurological deficits or other historical or exam findings to suggest an acute CVA.  No fever or limitation of neck mobility other findings on history or exam to suggest deep space infection.  No significant prevertebral edema on neck x-ray.  Low suspicion for ACS despite nonspecific ST change in V3 given 2 nonelevated troponins obtained over 2 hours.  Chest x-ray is unremarkable.  Additional above-noted etiologies are all within differential however given stable vitals with patient tolerating liquid and denying any concerns for shortness of breath with apparent widely patent airway and no evidence of hypoxia tachypnea or shortness of breath I believe he is safe for discharge with plan for close outpatient GI follow-up for further evaluation of his dysphagia.  Advised patient to maintain a liquid diet at all times immediately return to the emergency room should he experience any new or acute worsening of symptoms.  Discharged in stable condition.   ____________________________________________   FINAL CLINICAL IMPRESSION(S) / ED DIAGNOSES  Final diagnoses:  Dysphagia, unspecified type    Medications - No data to display   ED Discharge  Orders    None       Note:  This document was prepared using Dragon voice recognition software and may include unintentional dictation errors.   Gilles Chiquito, MD 03/30/20 [phone]

## 2020-04-08 ENCOUNTER — Encounter: Payer: Self-pay | Admitting: Gastroenterology

## 2020-04-08 ENCOUNTER — Other Ambulatory Visit: Payer: Self-pay

## 2020-04-08 ENCOUNTER — Telehealth (INDEPENDENT_AMBULATORY_CARE_PROVIDER_SITE_OTHER): Payer: Commercial Managed Care - PPO | Admitting: Gastroenterology

## 2020-04-08 DIAGNOSIS — I10 Essential (primary) hypertension: Secondary | ICD-10-CM | POA: Insufficient documentation

## 2020-04-08 DIAGNOSIS — R1319 Other dysphagia: Secondary | ICD-10-CM

## 2020-04-08 DIAGNOSIS — Z5329 Procedure and treatment not carried out because of patient's decision for other reasons: Secondary | ICD-10-CM | POA: Insufficient documentation

## 2020-04-08 DIAGNOSIS — E039 Hypothyroidism, unspecified: Secondary | ICD-10-CM | POA: Insufficient documentation

## 2020-04-08 DIAGNOSIS — Z5320 Procedure and treatment not carried out because of patient's decision for unspecified reasons: Secondary | ICD-10-CM | POA: Insufficient documentation

## 2020-04-08 NOTE — Progress Notes (Signed)
Lannette Donath, MD [address]  [address]  [place], [place] [address]  Main: [phone]  Fax: [phone]    Gastroenterology Consultation Tele Visit  Referring Provider:     Myrene Buddy, * Primary Care Physician:  Myrene Buddy, NP Primary Gastroenterologist:  Dr. Arlyss Repress Reason for Consultation:     Dysphagia        HPI:   Thomas Dickson is a 60 y.o. male referred by Dr. Bayard Males, Hermenia Fiscal, NP  for consultation & management of dysphagia.   Virtual Visit via Telephone Note  I connected with Brayton El on 04/08/20 at  3:15 PM EST by telephone and verified that I am speaking with the correct person using two identifiers.   I discussed the limitations, risks, security and privacy concerns of performing an evaluation and management service by telephone and the availability of in person appointments. I also discussed with the patient that there may be a patient responsible charge related to this service. The patient expressed understanding and agreed to proceed.  Location of the Patient: Home  Location of the provider: Office  Persons participating in the visit: Patient and provider only  History of Present Illness: Mr. Thomas Dickson is a 60 year old male who went to ER on 12/25 as he felt he had hot pocket stuck in his throat. He had X ray neck which was negative for radioopaque foreign body. He went to his PCP on 12/28, was given prilosec 40mg  daily. However his symptoms are persistent, feels like something is stuck in his throat. He is able to tolerate soft/pured food only. He is also experiencing pills getting stuck in his throat. He was Covid positive and suffered from pneumonia more than 2 weeks ago. He is not experiencing chest pain or shortness of breath. He did not receive any steroids for Covid pneumonia. He received only ibuprofen.  He does not smoke or drink alcohol. He denies similar episodes in the past. He denies  heartburn.    NSAIDs: None  Antiplts/Anticoagulants/Anti thrombotics: None  GI Procedures: Colonoscopy 2009  Past Medical History:  Diagnosis Date  . Hypertension   . Thyroid disease     Past Surgical History:  Procedure Laterality Date  . ABDOMINAL HERNIA REPAIR     2006  . hemmorhoid      Current Outpatient Medications:  .  levothyroxine (SYNTHROID, LEVOTHROID) 75 MCG tablet, Take 75 mcg by mouth daily before breakfast., Disp: , Rfl:  .  metoprolol succinate (TOPROL-XL) 50 MG 24 hr tablet, Take 50 mg by mouth daily. Take with or immediately following a meal., Disp: , Rfl:  .  omeprazole (PRILOSEC) 40 MG capsule, Take 40 mg by mouth daily., Disp: , Rfl:  .  valsartan (DIOVAN) 40 MG tablet, Take 40 mg by mouth daily., Disp: , Rfl:    Family History  Problem Relation Age of Onset  . Diabetes Mother   . Cancer Mother   . Cancer Father   . Thyroid disease Sister   . Cancer Sister   . Diabetes Brother   . Thyroid disease Brother      Social History   Tobacco Use  . Smoking status: Former 2007  . Smokeless tobacco: Never Used  Vaping Use  . Vaping Use: Never used  Substance Use Topics  . Alcohol use: No  . Drug use: No    Allergies as of 04/08/2020 - Review Complete 04/08/2020  Allergen Reaction Noted  . Aspirin Other (See Comments)  09/09/2014  . Sulfa antibiotics Other (See Comments) 09/09/2014     Imaging Studies: Reviewed  Assessment and Plan:   Thomas Dickson is a 60 y.o. white male with history of hypertension, hypothyroidism is seen in consultation for acute onset of dysphagia to solids and pills.  Recommend upper endoscopy for further evaluation Continue Prilosec 40 mg daily before breakfast Avoid hard foods, diet as tolerated only   Follow Up Instructions:   I discussed the assessment and treatment plan with the patient. The patient was provided an opportunity to ask questions and all were answered. The patient agreed with the plan and  demonstrated an understanding of the instructions.   The patient was advised to call back or seek an in-person evaluation if the symptoms worsen or if the condition fails to improve as anticipated.  I provided 25 minutes of non-face-to-face time during this encounter.   Follow up after the upper endoscopy   Arlyss Repress, MD

## 2020-04-09 NOTE — Pre-Procedure Instructions (Signed)
Patient does not need to bwe covid tested today for his upcoming procedure as he shows positive in Care Everywhere on 03/21/20.

## 2020-04-10 ENCOUNTER — Encounter: Payer: Self-pay | Admitting: Gastroenterology

## 2020-04-10 ENCOUNTER — Ambulatory Visit: Payer: Commercial Managed Care - PPO | Admitting: Gastroenterology

## 2020-04-11 ENCOUNTER — Encounter: Payer: Self-pay | Admitting: Gastroenterology

## 2020-04-11 ENCOUNTER — Ambulatory Visit: Payer: Commercial Managed Care - PPO | Admitting: Anesthesiology

## 2020-04-11 ENCOUNTER — Other Ambulatory Visit: Payer: Self-pay

## 2020-04-11 ENCOUNTER — Ambulatory Visit
Admission: RE | Admit: 2020-04-11 | Discharge: 2020-04-11 | Disposition: A | Discharge status: Home / Self Care | Payer: Commercial Managed Care - PPO | Attending: Gastroenterology | Admitting: Gastroenterology

## 2020-04-11 ENCOUNTER — Encounter
Admission: RE | Disposition: A | Discharge status: Home / Self Care | Payer: Self-pay | Source: Home / Self Care | Attending: Gastroenterology

## 2020-04-11 DIAGNOSIS — Z79890 Hormone replacement therapy (postmenopausal): Secondary | ICD-10-CM | POA: Insufficient documentation

## 2020-04-11 DIAGNOSIS — Z79899 Other long term (current) drug therapy: Secondary | ICD-10-CM | POA: Insufficient documentation

## 2020-04-11 DIAGNOSIS — Z886 Allergy status to analgesic agent status: Secondary | ICD-10-CM | POA: Insufficient documentation

## 2020-04-11 DIAGNOSIS — R1319 Other dysphagia: Secondary | ICD-10-CM | POA: Diagnosis not present

## 2020-04-11 DIAGNOSIS — R1310 Dysphagia, unspecified: Secondary | ICD-10-CM | POA: Insufficient documentation

## 2020-04-11 DIAGNOSIS — Z882 Allergy status to sulfonamides status: Secondary | ICD-10-CM | POA: Insufficient documentation

## 2020-04-11 DIAGNOSIS — K2100 Gastro-esophageal reflux disease with esophagitis, without bleeding: Secondary | ICD-10-CM | POA: Diagnosis not present

## 2020-04-11 HISTORY — DX: Hypothyroidism, unspecified: E03.9

## 2020-04-11 HISTORY — PX: ESOPHAGOGASTRODUODENOSCOPY (EGD) WITH PROPOFOL: SHX5813

## 2020-04-11 SURGERY — ESOPHAGOGASTRODUODENOSCOPY (EGD) WITH PROPOFOL
Anesthesia: General

## 2020-04-11 MED ORDER — DEXMEDETOMIDINE (PRECEDEX) IN NS 20 MCG/5ML (4 MCG/ML) IV SYRINGE
PREFILLED_SYRINGE | INTRAVENOUS | Status: AC
  Filled 2020-04-11: qty 5

## 2020-04-11 MED ORDER — GLYCOPYRROLATE 0.2 MG/ML IJ SOLN
INTRAMUSCULAR | Status: AC
  Filled 2020-04-11: qty 2

## 2020-04-11 MED ORDER — FENTANYL CITRATE (PF) 100 MCG/2ML IJ SOLN
INTRAMUSCULAR | Status: DC | PRN
  Administered 2020-04-11: 50 ug via INTRAVENOUS

## 2020-04-11 MED ORDER — PROPOFOL 10 MG/ML IV BOLUS
INTRAVENOUS | Status: DC | PRN
  Administered 2020-04-11 (×2): 50 mg via INTRAVENOUS

## 2020-04-11 MED ORDER — PROPOFOL 500 MG/50ML IV EMUL
INTRAVENOUS | Status: DC | PRN
  Administered 2020-04-11: 150 ug/kg/min via INTRAVENOUS

## 2020-04-11 MED ORDER — FENTANYL CITRATE (PF) 100 MCG/2ML IJ SOLN
INTRAMUSCULAR | Status: AC
  Filled 2020-04-11: qty 2

## 2020-04-11 MED ORDER — LIDOCAINE HCL (CARDIAC) PF 100 MG/5ML IV SOSY
PREFILLED_SYRINGE | INTRAVENOUS | Status: DC | PRN
  Administered 2020-04-11: 100 mg via INTRAVENOUS

## 2020-04-11 MED ORDER — SODIUM CHLORIDE 0.9 % IV SOLN
INTRAVENOUS | Status: DC
  Administered 2020-04-11: 1000 mL via INTRAVENOUS

## 2020-04-11 MED ORDER — PROPOFOL 10 MG/ML IV BOLUS
INTRAVENOUS | Status: AC
  Filled 2020-04-11: qty 20

## 2020-04-11 MED ORDER — ONDANSETRON HCL 4 MG/2ML IJ SOLN
INTRAMUSCULAR | Status: AC
  Filled 2020-04-11: qty 4

## 2020-04-11 MED ORDER — LACTATED RINGERS IV SOLN: INTRAVENOUS | Status: DC | PRN

## 2020-04-11 NOTE — Transfer of Care (Signed)
Immediate Anesthesia Transfer of Care Note  Patient: Thomas Dickson  Procedure(s) Performed: ESOPHAGOGASTRODUODENOSCOPY (EGD) WITH PROPOFOL (N/A )  Patient Location: PACU and Endoscopy Unit  Anesthesia Type:General  Level of Consciousness: awake, alert  and oriented  Airway & Oxygen Therapy: Patient Spontanous Breathing  Post-op Assessment: Report given to RN and Post -op Vital signs reviewed and stable  Post vital signs: Reviewed and stable  Last Vitals:  Vitals Value Taken Time  BP 101/58 04/11/20 1213  Temp    Pulse 80 04/11/20 1213  Resp 20 04/11/20 1213  SpO2 96 % 04/11/20 1213  Vitals shown include unvalidated device data.  Last Pain:  Vitals:   04/11/20 1211  TempSrc:   PainSc: 0-No pain         Complications: No complications documented.

## 2020-04-11 NOTE — Anesthesia Preprocedure Evaluation (Addendum)
Anesthesia Evaluation  Patient identified by MRN, date of birth, ID band Patient awake    Reviewed: Allergy & Precautions, H&P , NPO status , Patient's Chart, lab work & pertinent test results  History of Anesthesia Complications Negative for: history of anesthetic complications  Airway Mallampati: III  TM Distance: >3 FB Neck ROM: full    Dental  (+) Chipped, Missing, Poor Dentition, Upper Dentures   Pulmonary neg pulmonary ROS, neg shortness of breath, former smoker,    Pulmonary exam normal        Cardiovascular Exercise Tolerance: Good hypertension, (-) angina(-) Past MI and (-) DOE Normal cardiovascular exam     Neuro/Psych negative neurological ROS  negative psych ROS   GI/Hepatic negative GI ROS, Neg liver ROS,   Endo/Other  Hypothyroidism   Renal/GU negative Renal ROS  negative genitourinary   Musculoskeletal   Abdominal   Peds  Hematology negative hematology ROS (+)   Anesthesia Other Findings Past Medical History: No date: Hypertension No date: Hypothyroidism No date: Thyroid disease  Past Surgical History: No date: ABDOMINAL HERNIA REPAIR     Comment:  2006 No date: hemmorhoid No date: HERNIA REPAIR  BMI    Body Mass Index: 26.21 kg/m      Reproductive/Obstetrics negative OB ROS                            Anesthesia Physical Anesthesia Plan  ASA: III  Anesthesia Plan: General   Post-op Pain Management:    Induction: Intravenous  PONV Risk Score and Plan: Propofol infusion and TIVA  Airway Management Planned: Natural Airway and Nasal Cannula  Additional Equipment:   Intra-op Plan:   Post-operative Plan:   Informed Consent: I have reviewed the patients History and Physical, chart, labs and discussed the procedure including the risks, benefits and alternatives for the proposed anesthesia with the patient or authorized representative who has indicated  his/her understanding and acceptance.     Dental Advisory Given  Plan Discussed with: Anesthesiologist, CRNA and Surgeon  Anesthesia Plan Comments: (COVID + in December, ok'd to proceed by Infection Prevention department  Patient consented for risks of anesthesia including but not limited to:  - adverse reactions to medications - risk of airway placement if required - damage to eyes, teeth, lips or other oral mucosa - nerve damage due to positioning  - sore throat or hoarseness - Damage to heart, brain, nerves, lungs, other parts of body or loss of life  Patient voiced understanding.)       Anesthesia Quick Evaluation

## 2020-04-11 NOTE — H&P (Signed)
Arlyss Repress, MD [address]  [address]  [place], [place] [address]  Main: [phone]  Fax: [phone] Pager: [phone]  Primary Care Physician:  Myrene Buddy, NP Primary Gastroenterologist:  Dr. Arlyss Repress  Pre-Procedure History & Physical: HPI:  Thomas Dickson is a 60 y.o. male is here for an endoscopy.   Past Medical History:  Diagnosis Date  . Hypertension   . Hypothyroidism   . Thyroid disease     Past Surgical History:  Procedure Laterality Date  . ABDOMINAL HERNIA REPAIR     2006  . hemmorhoid    . HERNIA REPAIR      Prior to Admission medications   Medication Sig Start Date End Date Taking? Authorizing Provider  levothyroxine (SYNTHROID, LEVOTHROID) 75 MCG tablet Take 75 mcg by mouth daily before breakfast.   Yes [provider]  metoprolol succinate (TOPROL-XL) 50 MG 24 hr tablet Take 50 mg by mouth daily. Take with or immediately following a meal.   Yes [provider]  omeprazole (PRILOSEC) 40 MG capsule Take 40 mg by mouth daily. 04/02/20  Yes [provider]  valsartan (DIOVAN) 40 MG tablet Take 40 mg by mouth daily. 01/16/20  Yes [provider]    Allergies as of 04/08/2020 - Review Complete 04/08/2020  Allergen Reaction Noted  . Aspirin Other (See Comments) 09/09/2014  . Sulfa antibiotics Other (See Comments) 09/09/2014    Family History  Problem Relation Age of Onset  . Diabetes Mother   . Cancer Mother   . Cancer Father   . Thyroid disease Sister   . Cancer Sister   . Diabetes Brother   . Thyroid disease Brother     Social History   Socioeconomic History  . Marital status: Married    Spouse name: Not on file  . Number of children: Not on file  . Years of education: Not on file  . Highest education level: Not on file  Occupational History  . Not on file  Tobacco Use  . Smoking status: Former Smoker    Quit date: 1994    Years since quitting: 28.0  . Smokeless  tobacco: Never Used  Vaping Use  . Vaping Use: Never used  Substance and Sexual Activity  . Alcohol use: No  . Drug use: No  . Sexual activity: Not on file  Other Topics Concern  . Not on file  Social History Narrative  . Not on file   Social Determinants of Health   Financial Resource Strain: Not on file  Food Insecurity: Not on file  Transportation Needs: Not on file  Physical Activity: Not on file  Stress: Not on file  Social Connections: Not on file  Intimate Partner Violence: Not on file    Review of Systems: See HPI, otherwise negative ROS  Physical Exam: BP (!) 151/103   Pulse (!) 106   Temp (!) 97.4 F (36.3 C) (Temporal)   Resp 18   Ht 5' 10.5" (1.791 m)   Wt 84.1 kg   SpO2 100%   BMI 26.21 kg/m  General:   Alert,  pleasant and cooperative in NAD Head:  Normocephalic and atraumatic. Neck:  Supple; no masses or thyromegaly. Lungs:  Clear throughout to auscultation.    Heart:  Regular rate and rhythm. Abdomen:  Soft, nontender and nondistended. Normal bowel sounds, without guarding, and without rebound.   Neurologic:  Alert and  oriented x4;  grossly normal neurologically.  Impression/Plan: Thomas Dickson  is here for an endoscopy to be performed for dysphagia  Risks, benefits, limitations, and alternatives regarding  endoscopy have been reviewed with the patient.  Questions have been answered.  All parties agreeable.   Lannette Donath, MD  04/11/2020, 10:40 AM

## 2020-04-11 NOTE — Op Note (Signed)
[facility] Patient Name: Thomas Dickson Procedure Date: 04/10/2020 10:34 AM MRN: [id number] Account #: [id number] Date of Birth: [birth year] Admit Type: Outpatient Age: 60 Room: [facility] ENDO ROOM 4 Gender: Male Note Status: Finalized Procedure:             Upper GI endoscopy Indications:           Dysphagia Providers:             Toney Reil MD, MD Referring MD:          Caryl Asp (Referring MD) Medicines:             General Anesthesia Complications:         No immediate complications. Estimated blood loss: None. Procedure:             Pre-Anesthesia Assessment:                        - Prior to the procedure, a History and Physical was                         performed, and patient medications and allergies were                         reviewed. The patient is competent. The risks and                         benefits of the procedure and the sedation options and                         risks were discussed with the patient. All questions                         were answered and informed consent was obtained.                         Patient identification and proposed procedure were                         verified by the physician, the nurse, the                         anesthesiologist, the anesthetist and the technician                         in the pre-procedure area in the procedure room in the                         endoscopy suite. Mental Status Examination: alert and                         oriented. Airway Examination: normal oropharyngeal                         airway and neck mobility. Respiratory Examination:                         clear to auscultation. CV Examination: normal.  Prophylactic Antibiotics: The patient does not require                         prophylactic antibiotics. Prior Anticoagulants: The                         patient has taken no previous anticoagulant or                          antiplatelet agents. ASA Grade Assessment: II - A                         patient with mild systemic disease. After reviewing                         the risks and benefits, the patient was deemed in                         satisfactory condition to undergo the procedure. The                         anesthesia plan was to use general anesthesia.                         Immediately prior to administration of medications,                         the patient was re-assessed for adequacy to receive                         sedatives. The heart rate, respiratory rate, oxygen                         saturations, blood pressure, adequacy of pulmonary                         ventilation, and response to care were monitored                         throughout the procedure. The physical status of the                         patient was re-assessed after the procedure.                        After obtaining informed consent, the endoscope was                         passed under direct vision. Throughout the procedure,                         the patient's blood pressure, pulse, and oxygen                         saturations were monitored continuously. The Endoscope                         was introduced through the mouth, and advanced to the  second part of duodenum. The upper GI endoscopy was                         accomplished without difficulty. The patient tolerated                         the procedure well. Findings:      The esophagus, gastroesophageal junction and examined esophagus were       normal. Biopsies were taken with a cold forceps for histology.      The stomach was normal.      The examined duodenum was normal.      Esophagogastric landmarks were identified: the gastroesophageal junction       was found at 40 cm from the incisors. Impression:            - Normal esophagus and gastroesophageal junction.                         Biopsied.                         - Normal stomach.                        - Normal examined duodenum.                        - Esophagogastric landmarks identified. Recommendation:        - Await pathology results.                        - Discharge patient to home (with escort).                        - Resume regular diet today.                        - Continue present medications. Procedure Code(s):     --- Professional ---                        [phone], Esophagogastroduodenoscopy, flexible,                         transoral; with biopsy, single or multiple Diagnosis Code(s):     --- Professional ---                        R13.10, Dysphagia, unspecified CPT copyright 2019 American Medical Association. All rights reserved. The codes documented in this report are preliminary and upon coder review may  be revised to meet current compliance requirements. Dr. Libby Maw Toney Reil MD, MD 04/11/2020 12:09:39 PM This report has been signed electronically. Number of Addenda: 0 Note Initiated On: 04/10/2020 10:34 AM Estimated Blood Loss:  Estimated blood loss: none.      [facility]

## 2020-04-11 NOTE — Anesthesia Procedure Notes (Signed)
Procedure Name: MAC Date/Time: 04/11/2020 12:05 PM Performed by: Lily Peer, Jyla Hopf, CRNA Pre-anesthesia Checklist: Patient identified, Emergency Drugs available, Suction available and Patient being monitored Oxygen Delivery Method: Nasal cannula Induction Type: IV induction

## 2020-04-12 LAB — SURGICAL PATHOLOGY

## 2020-04-12 NOTE — Anesthesia Postprocedure Evaluation (Signed)
Anesthesia Post Note  Patient: Thomas Dickson  Procedure(s) Performed: ESOPHAGOGASTRODUODENOSCOPY (EGD) WITH PROPOFOL (N/A )  Patient location during evaluation: Endoscopy Anesthesia Type: General Level of consciousness: awake and alert Pain management: pain level controlled Vital Signs Assessment: post-procedure vital signs reviewed and stable Respiratory status: spontaneous breathing, nonlabored ventilation, respiratory function stable and patient connected to nasal cannula oxygen Cardiovascular status: blood pressure returned to baseline and stable Postop Assessment: no apparent nausea or vomiting Anesthetic complications: no   No complications documented.   Last Vitals:  Vitals:   04/11/20 1221 04/11/20 1231  BP: 107/62 123/86  Pulse: 77 79  Resp: 19 19  Temp:    SpO2: 97% 100%    Last Pain:  Vitals:   04/11/20 1231  TempSrc:   PainSc: 0-No pain                 Cleda Mccreedy Carnelia Oscar

## 2020-04-30 ENCOUNTER — Other Ambulatory Visit: Payer: Self-pay

## 2020-04-30 ENCOUNTER — Ambulatory Visit
Admission: EM | Admit: 2020-04-30 | Discharge: 2020-04-30 | Disposition: A | Discharge status: Home / Self Care | Payer: Commercial Managed Care - PPO | Attending: Family Medicine | Admitting: Family Medicine

## 2020-04-30 ENCOUNTER — Encounter: Payer: Self-pay | Admitting: Emergency Medicine

## 2020-04-30 ENCOUNTER — Telehealth: Payer: Self-pay | Admitting: Gastroenterology

## 2020-04-30 DIAGNOSIS — R682 Dry mouth, unspecified: Secondary | ICD-10-CM

## 2020-04-30 DIAGNOSIS — R1310 Dysphagia, unspecified: Secondary | ICD-10-CM | POA: Diagnosis not present

## 2020-04-30 DIAGNOSIS — R1319 Other dysphagia: Secondary | ICD-10-CM

## 2020-04-30 NOTE — Telephone Encounter (Signed)
Tried to call patient but voicemail is full. Sent mychart message asking patient to call back. Tried a second time and unable to leave a voicemail due to voicemail being full

## 2020-04-30 NOTE — Telephone Encounter (Signed)
Patient verbalized understanding he states he feel like his throat is about swollen shut. He states he has not eaten anything all day. He is breathing fine out of his nose and he is able to drink water. He states he is almost feels like he has had strep throat.  He is still taking the omeprazole. Placed referral for esophageal manometry. Advised patient If he feels like is throat is swollen shut he needs to go to the ER. He said but he is  Not having any trouble breathing. Please advised

## 2020-04-30 NOTE — Telephone Encounter (Signed)
His EGD was unremarkable.  To me, it sounds more like a panic attack.  Is he taking omeprazole if yes, go ahead and refer him for esophageal manometry  Thanks RV

## 2020-04-30 NOTE — ED Triage Notes (Signed)
Pt presents today with c/o of sore throat x 1 day. He also reports waking of from sleep gasping for air. Denies fever.

## 2020-04-30 NOTE — Addendum Note (Signed)
Addended by: Radene Knee L on: 04/30/2020 01:42 PM   Modules accepted: Orders

## 2020-04-30 NOTE — Telephone Encounter (Signed)
Patient woke up at 4:30 am and very scared that his throat was closed up unable to get air. Mouth and throat stay very dry and unable to get moisture. Please call ASAP.

## 2020-04-30 NOTE — Discharge Instructions (Addendum)
Continue your current diet.  If you worsen, go to the ER.  Swallow study scheduled for 1/28 @ 1230 [facility]). Do not eat or drink for 3 hours prior.  Take care  Dr. Adriana Simas

## 2020-04-30 NOTE — Telephone Encounter (Signed)
Patient states he woke up out of sleep and states he was drying to suck in and could not. He states he dried twice and was not able to and then finally the 3rd try he was able to breath normally. He states this really scared him because this has never happen. He states now is throat and mouth is really dry. He has drank 2 16oz of water and still mouth and throat are very dry. He states if he talks his throat feels itchy. He states this morning he had trouble taking his medication. He had trouble swallowing  them. He states he was only able to take 1 of them which was a very small pill with applesauce.  He states he feels like the top of his throat is swollen.

## 2020-04-30 NOTE — Telephone Encounter (Signed)
If he is concerned about strep throat, he should either go to urgent care or contact his PCP  RV

## 2020-04-30 NOTE — Telephone Encounter (Signed)
Patient states he will contact PCP or urgent care to get tested for strep throat

## 2020-05-01 NOTE — ED Provider Notes (Signed)
[facility]    CSN: [id number] Arrival date & time: 04/30/20  1536      History   Chief Complaint Chief Complaint  Patient presents with  . Sore Throat  . Shortness of Breath    HPI  60 year old male presents with the above complaints.   Patient states that he has had ongoing dysphagia. He can tolerate liquids and soft foods. Has had recent EGD which was normal. He states that he awoke last night and felt like he was choking or like his throat was "swollen". Reports dry mouth and throat. No respiratory symptoms. No relieving factors. No other complaints.    Past Medical History:  Diagnosis Date  . COVID-19 03/21/2020  . Hypertension   . Hypothyroidism   . Thyroid disease     Patient Active Problem List   Diagnosis Date Noted  . Esophageal dysphagia   . Hypertension 04/08/2020  . Hypothyroidism, acquired 04/08/2020  . Refusal of statin medication by patient 04/08/2020  . IGT (impaired glucose tolerance) 05/07/2018    Past Surgical History:  Procedure Laterality Date  . ABDOMINAL HERNIA REPAIR     2006  . ESOPHAGOGASTRODUODENOSCOPY (EGD) WITH PROPOFOL N/A 04/11/2020   Procedure: ESOPHAGOGASTRODUODENOSCOPY (EGD) WITH PROPOFOL;  Surgeon: Toney Reil, MD;  Location: [facility] ENDOSCOPY;  Service: Gastroenterology;  Laterality: N/A;  . hemmorhoid    . HERNIA REPAIR         Home Medications    Prior to Admission medications   Medication Sig Start Date End Date Taking? Authorizing Provider  levothyroxine (SYNTHROID, LEVOTHROID) 75 MCG tablet Take 75 mcg by mouth daily before breakfast.   Yes [provider]  metoprolol succinate (TOPROL-XL) 50 MG 24 hr tablet Take 50 mg by mouth daily. Take with or immediately following a meal.   Yes [provider]  omeprazole (PRILOSEC) 40 MG capsule Take 40 mg by mouth daily. 04/02/20  Yes [provider]  valsartan (DIOVAN) 40 MG tablet Take 40 mg by mouth daily. 01/16/20  Yes [provider]    Family History Family History  Problem Relation Age of Onset  . Diabetes Mother   . Cancer Mother   . Cancer Father   . Thyroid disease Sister   . Cancer Sister   . Diabetes Brother   . Thyroid disease Brother     Social History Social History   Tobacco Use  . Smoking status: Former Smoker    Quit date: 1994    Years since quitting: 28.0  . Smokeless tobacco: Never Used  Vaping Use  . Vaping Use: Never used  Substance Use Topics  . Alcohol use: No  . Drug use: No     Allergies   Aspirin and Sulfa antibiotics   Review of Systems Review of Systems  HENT: Positive for sore throat and trouble swallowing.        Dry mouth/throat.  Respiratory: Positive for shortness of breath.    Physical Exam Triage Vital Signs ED Triage Vitals  Enc Vitals Group     BP 04/30/20 1554 (!) 170/99     Pulse Rate 04/30/20 1554 (!) 101     Resp 04/30/20 1554 18     Temp 04/30/20 1554 98.7 F (37.1 C)     Temp Source 04/30/20 1554 Oral     SpO2 04/30/20 1554 99 %     Weight 04/30/20 1550 185 lb 6.5 oz (84.1 kg)     Height 04/30/20 1550 5' 10.5" (1.791  m)     Head Circumference --      Peak Flow --      Pain Score 04/30/20 1550 5     Pain Loc --      Pain Edu? --      Excl. in GC? --    Updated Vital Signs BP (!) 170/99   Pulse (!) 101   Temp 98.7 F (37.1 C) (Oral)   Resp 18   Ht 5' 10.5" (1.791 m)   Wt 84.1 kg   SpO2 99%   BMI 26.23 kg/m   Visual Acuity Right Eye Distance:   Left Eye Distance:   Bilateral Distance:    Right Eye Near:   Left Eye Near:    Bilateral Near:     Physical Exam Vitals and nursing note reviewed.  Constitutional:      General: He is not in acute distress.    Appearance: He is well-developed. He is not ill-appearing.  HENT:     Head: Normocephalic and atraumatic.     Mouth/Throat:     Pharynx: Posterior oropharyngeal erythema present. No oropharyngeal exudate.  Cardiovascular:     Rate and Rhythm: Normal rate  and regular rhythm.  Pulmonary:     Effort: Pulmonary effort is normal.     Breath sounds: Normal breath sounds. No wheezing, rhonchi or rales.  Abdominal:     General: There is no distension.     Palpations: Abdomen is soft.     Tenderness: There is no abdominal tenderness.  Neurological:     Mental Status: He is alert.  Psychiatric:        Mood and Affect: Mood normal.        Behavior: Behavior normal.    UC Treatments / Results  Labs (all labs ordered are listed, but only abnormal results are displayed) Labs Reviewed - No data to display  EKG   Radiology No results found.  Procedures Procedures (including critical care time)  Medications Ordered in UC Medications - No data to display  Initial Impression / Assessment and Plan / UC Course  I have reviewed the triage vital signs and the nursing notes.  Pertinent labs & imaging results that were available during my care of the patient were reviewed by me and considered in my medical decision making (see chart for details).    60 year old male presents with ongoing dysphagia. Exam unremarkable. Well appearing. Arranging swallow study.   Final Clinical Impressions(s) / UC Diagnoses   Final diagnoses:  Dysphagia, unspecified type  Dry mouth     Discharge Instructions     Continue your current diet.  If you worsen, go to the ER.  Swallow study scheduled for 1/28 @ 1230 [facility]). Do not eat or drink for 3 hours prior.  Take care  Dr. Adriana Simas    ED Prescriptions    None     PDMP not reviewed this encounter.   Tommie Sams, [place] 05/01/20 [phone]

## 2020-05-03 ENCOUNTER — Other Ambulatory Visit: Payer: Self-pay

## 2020-05-03 ENCOUNTER — Other Ambulatory Visit: Payer: Self-pay | Admitting: Family Medicine

## 2020-05-03 ENCOUNTER — Ambulatory Visit
Admission: RE | Admit: 2020-05-03 | Discharge: 2020-05-03 | Disposition: A | Discharge status: Home / Self Care | Payer: Commercial Managed Care - PPO | Source: Ambulatory Visit | Attending: Family Medicine | Admitting: Family Medicine

## 2020-05-03 DIAGNOSIS — R1310 Dysphagia, unspecified: Secondary | ICD-10-CM | POA: Diagnosis present

## 2020-05-13 ENCOUNTER — Telehealth: Payer: Self-pay | Admitting: *Deleted

## 2020-05-13 ENCOUNTER — Other Ambulatory Visit: Payer: Self-pay

## 2020-05-13 ENCOUNTER — Telehealth: Payer: Self-pay | Admitting: Gastroenterology

## 2020-05-13 NOTE — Telephone Encounter (Signed)
Patient wanted to make sure he still needed to do the Esophageal manometry. Informed patient he does and he states he will call back and scheduled the appointment

## 2020-05-13 NOTE — Telephone Encounter (Signed)
Patient called back and LM on VM to call him about orders for future tests.

## 2020-05-13 NOTE — Telephone Encounter (Signed)
Good morning Dr. Chales Abrahams, we received a referral from [facility] for patient to be seen for esophageal manometry.  Records are in epic.  Can you please review and advise on scheduling?  Thank you.

## 2020-05-13 NOTE — Telephone Encounter (Signed)
He can take MiraLAX 17 g twice once or twice daily He can also take magnesium citrate as needed  RV

## 2020-05-13 NOTE — Telephone Encounter (Signed)
Patient verbalized understanding  

## 2020-05-13 NOTE — Telephone Encounter (Signed)
Patient LVM reporting since he has been on Omeprazole (Prilosec) 40 mg he has been constipated. Patient reports he called his PCP and was advised to take stool softeners and fiber which has not helped. Patient wants to know which laxative can he take?

## 2020-05-14 ENCOUNTER — Telehealth: Payer: Self-pay | Admitting: Gastroenterology

## 2020-05-14 MED ORDER — DEXLANSOPRAZOLE 60 MG PO CPDR
60.0000 mg | DELAYED_RELEASE_CAPSULE | Freq: Every day | ORAL | 1 refills | Status: DC
Start: 2020-05-14 — End: 2020-06-24

## 2020-05-14 NOTE — Telephone Encounter (Signed)
Patient sent a mychart message and I have sent mychart message to Dr. Allegra Lai

## 2020-05-14 NOTE — Telephone Encounter (Signed)
Patient states he is taking omeprazole 40mg  twice day. He states it is not helping his acid reflex. Informed patient he could come pick up samples of Dexilant he states he wants to start the new medication and he wants it sent to his pharmacy. He states he has used Tums 3 times today and it works for 30 minutes and then symptoms come back  He states he has some magnesium citrate and will use this tonight. He sates he has a acid taste in his mouth and is constipated. He states he feels like he needs to go to the bathroom but he can not go. He states he is still having trouble swallowing pills and he is hardly eating because he is unable to swallow. He  States he has loss so much weight and you can see his ribs. He states he needs some help. Has follow up appointment on Friday.

## 2020-05-14 NOTE — Telephone Encounter (Signed)
Patient has questions about acid coming up in mouth and throat with bitter taste.  Patient had questions about OTC laxative as well.

## 2020-05-14 NOTE — Addendum Note (Signed)
Addended by: Radene Knee L on: 05/14/2020 04:42 PM   Modules accepted: Orders

## 2020-05-14 NOTE — Telephone Encounter (Signed)
Eso dysphagia, negative EGD. Eso Bx-negative for EoE but did show reflux  Recommended to proceed with high-resolution manometry. Bianca, please set it up.  Hold off on pH study (Bx did show reflux, I do not believe he can tolerate catheter, if it needs to be done in future, would consider [facility])  RG

## 2020-05-15 ENCOUNTER — Other Ambulatory Visit: Payer: Self-pay

## 2020-05-15 DIAGNOSIS — R1319 Other dysphagia: Secondary | ICD-10-CM

## 2020-05-15 NOTE — Telephone Encounter (Signed)
We referred him to [facility] in [place] on 04/30/2020. Do you want him to go to [facility] instead

## 2020-05-15 NOTE — Telephone Encounter (Signed)
Thank you Dr. Chales Abrahams.  Beth can you please schedule patient?  Received a message from their PCP stating it's urgent.

## 2020-05-15 NOTE — Telephone Encounter (Signed)
Patient states he ate some egg whites and a whole grain [place] sandwich and he states he feels like it stuck in his throat. He states that he took the new Dexiant this morning and he drank the magnesium citrate last night. He states around 2:30 this morning he began to have bowel movements. He states he is feeling better this morning. He states [facility] called him on Monday and was going to talk to the doctor and get him scheduled. He states he will call them and see if they can see him now. Sent a message in the referral

## 2020-05-15 NOTE — Telephone Encounter (Signed)
Let's refer him to [facility] for esophageal manometry and pH impedence on PPI Dysphagia I will see him on Friday  RV

## 2020-05-15 NOTE — Telephone Encounter (Signed)
Can we get an estimate from [facility] when they can do?  RV

## 2020-05-15 NOTE — Telephone Encounter (Signed)
Referral for manometry received. Provider Dr Allegra Lai Practice name [facility]  Patient contacted. Instructed on COVID screening tomorrow 05/16/20 at 2:00 pm and procedure date 05/20/20 arrive at 12:00 pm. Patient understands to fast for 4 hours prior.  Briefly explained the procedure. Questions invited and answered.

## 2020-05-16 ENCOUNTER — Other Ambulatory Visit (HOSPITAL_COMMUNITY)
Admission: RE | Admit: 2020-05-16 | Discharge: 2020-05-16 | Disposition: A | Discharge status: Home / Self Care | Payer: Commercial Managed Care - PPO | Source: Ambulatory Visit | Attending: Gastroenterology | Admitting: Gastroenterology

## 2020-05-16 NOTE — Progress Notes (Signed)
Pt not tested for covid today due to pt testing + for covid on 03/21/20; results found in Care Everywhere. Based on the guidelines the pt is in the 90 day window to not retest. The pt is still expected to quarantine until their procedure. Therefore, the pt can still have the scheduled procedure. These are the guidelines as follows:  Guidance: Patient previously tested + COVID; now past 90 day window seeking elective surgery (asymptomatic)  Retest patient If negative, proceed with surgery If positive, postpone surgery for 10 days from positive test Patient to quarantine for the (10 days) Do not retest again prior to surgery (even if scheduled a couple of weeks out) Use standard precautions for surgery   Viviano Simas, RN

## 2020-05-17 ENCOUNTER — Other Ambulatory Visit: Payer: Self-pay

## 2020-05-17 ENCOUNTER — Ambulatory Visit: Payer: Commercial Managed Care - PPO | Admitting: Gastroenterology

## 2020-05-17 ENCOUNTER — Encounter: Payer: Self-pay | Admitting: Gastroenterology

## 2020-05-17 VITALS — BP 124/85 | HR 76 | Temp 97.1°F | Ht 70.5 in | Wt 153.6 lb

## 2020-05-17 DIAGNOSIS — R634 Abnormal weight loss: Secondary | ICD-10-CM

## 2020-05-17 DIAGNOSIS — R531 Weakness: Secondary | ICD-10-CM | POA: Diagnosis not present

## 2020-05-17 NOTE — Progress Notes (Signed)
Thomas Repress, MD [address]  [address]  [place], [place] [address]  Main: [phone]  Fax: [phone]    Gastroenterology Consultation  Referring Provider:     Myrene Buddy, * Primary Care Physician:  Myrene Buddy, NP Primary Gastroenterologist:  Dr. Arlyss Dickson Reason for Consultation:     Failure to thrive, globus sensation        HPI:   Thomas Dickson is a 60 y.o. male referred by Dr. Bayard Dickson, Thomas Fiscal, NP  for consultation & management of failure to thrive, globus sensation.  I have originally seen [name] on 1/3 after he felt like he had a hot pocket stuck in his throat.  He recovered from Covid pneumonia in December.  Symptoms started after this.  So far, he underwent upper endoscopy with biopsies which were unremarkable, no evidence of eosinophilic esophagitis or reflux esophagitis or Candida esophagitis.  He underwent barium swallow with barium tablet which was unremarkable.  Patient experienced constipation from omeprazole 40 mg.  Therefore, I switched to Dexilant 60 mg daily.  Apparently, patient has been significantly restricting his p.o. intake because he was experiencing bitter taste around 3 AM every day and could not fall back to sleep.  He thought it was because of acid reflux.  He keeps head of the bed elevated and takes Tums as needed.  He said, he was given information on food triggers for reflux by his PCP and has been eliminating dairy, fatty foods, greasy foods, meat, bread since then.  Unfortunately, for last 5 weeks, patient lost about 25 to 30 pounds.  He reports generalized weakness, loss of muscle mass, severe fatigue.  He denies any abdominal pain, rash, swelling of joints, fever, chills, joint pains.  I asked patient to drink water in the office and he was able to do it without aspiration.  He is frustrated with ongoing weight loss.  I referred him for esophageal manometry and he is scheduled to undergo on Monday next  week in [place].  NSAIDs: None  Antiplts/Anticoagulants/Anti thrombotics: None  GI Procedures:  Upper endoscopy 04/11/2020 The esophagus, gastroesophageal junction and examined esophagus were normal. Biopsies were taken with a cold forceps for histology. The stomach was normal. The examined duodenum was normal. Esophagogastric landmarks were identified: the gastroesophageal junction was found at 40 cm from the incisors. DIAGNOSIS:  A. ESOPHAGUS, RANDOM; COLD BIOPSY:  - REFLUX ESOPHAGITIS.  - NEGATIVE FOR INCREASED EOSINOPHILS.  - NEGATIVE FOR INTESTINAL METAPLASIA, DYSPLASIA, AND MALIGNANCY.   Past Medical History:  Diagnosis Date  . COVID-19 03/21/2020  . Hypertension   . Hypothyroidism   . Thyroid disease     Past Surgical History:  Procedure Laterality Date  . ABDOMINAL HERNIA REPAIR     2006  . ESOPHAGOGASTRODUODENOSCOPY (EGD) WITH PROPOFOL N/A 04/11/2020   Procedure: ESOPHAGOGASTRODUODENOSCOPY (EGD) WITH PROPOFOL;  Surgeon: Toney Reil, MD;  Location: [facility] ENDOSCOPY;  Service: Gastroenterology;  Laterality: N/A;  . hemmorhoid    . HERNIA REPAIR      Current Outpatient Medications:  .  dexlansoprazole (DEXILANT) 60 MG capsule, Take 1 capsule (60 mg total) by mouth daily., Disp: 30 capsule, Rfl: 1 .  levothyroxine (SYNTHROID, LEVOTHROID) 75 MCG tablet, Take 75 mcg by mouth daily before breakfast., Disp: , Rfl:  .  metoprolol succinate (TOPROL-XL) 50 MG 24 hr tablet, Take 50 mg by mouth daily. Take with or immediately following a meal., Disp: , Rfl:  .  valsartan (DIOVAN) 40 MG  tablet, Take 40 mg by mouth daily., Disp: , Rfl:    Family History  Problem Relation Age of Onset  . Diabetes Mother   . Cancer Mother   . Cancer Father   . Thyroid disease Sister   . Cancer Sister   . Diabetes Brother   . Thyroid disease Brother      Social History   Tobacco Use  . Smoking status: Former Smoker    Quit date: 1994    Years since quitting: 28.1  . Smokeless  tobacco: Never Used  Vaping Use  . Vaping Use: Never used  Substance Use Topics  . Alcohol use: No  . Drug use: No    Allergies as of 05/17/2020 - Review Complete 05/17/2020  Allergen Reaction Noted  . Aspirin Other (See Comments) 09/09/2014  . Sulfa antibiotics Other (See Comments) 09/09/2014    Review of Systems:    All systems reviewed and negative except where noted in HPI.   Physical Exam:  BP 124/85   Pulse 76   Temp (!) 97.1 F (36.2 C) (Temporal)   Ht 5' 10.5" (1.791 m)   Wt 153 lb 9.6 oz (69.7 kg)   BMI 21.73 kg/m  No LMP for male patient.  General:   Alert, thin built, pleasant and cooperative in NAD Head:  Normocephalic and atraumatic. Eyes:  Sclera clear, no icterus.   Conjunctiva pink. Ears:  Normal auditory acuity. Nose:  No deformity, discharge, or lesions. Mouth:  No deformity or lesions,oropharynx pink & moist. Neck:  Supple; no masses or thyromegaly. Lungs:  Respirations even and unlabored.  Clear throughout to auscultation.   No wheezes, crackles, or rhonchi. No acute distress. Heart:  Regular rate and rhythm; no murmurs, clicks, rubs, or gallops. Abdomen:  Normal bowel sounds. Soft, non-tender and non-distended without masses, hepatosplenomegaly or hernias noted.  No guarding or rebound tenderness.   Rectal: Not performed Msk:  Symmetrical without gross deformities. Good, equal movement & strength bilaterally. Pulses:  Normal pulses noted. Extremities:  No clubbing or edema.  No cyanosis. Neurologic:  Alert and oriented x3;  grossly normal neurologically. Skin:  Intact without significant lesions or rashes. No jaundice. Psych:  Alert and cooperative. Normal mood and affect.  Imaging Studies: Reviewed  Assessment and Plan:   Thomas Dickson is a 60 y.o. male with history of hypothyroidism, failure to thrive, s/p Covid pneumonia in 12/21 is seen in for follow-up of globus sensation  EGD is unremarkable including esophageal biopsies Barium  swallow is normal Esophageal manometry scheduled for 2/14 Refer to neurology to evaluate for any post Covid related neuromuscular disorder We will plan for CT neck as well as chest after manometry results Discussed in detail with patient that he does not need to restrict his diet at all.  Provided him with information on high-protein diet Continue Dexilant 60 mg daily for now Advised him to message me his weight log at least once a week on MyChart  Follow up in 2 to 3 weeks   Thomas Repress, MD

## 2020-05-17 NOTE — Patient Instructions (Signed)
High-Protein and High-Calorie Diet Eating high-protein and high-calorie foods can help you to gain weight, heal after an injury, and recover after an illness or surgery. The specific amount of daily protein and calories you need depends on:  Your body weight.  The reason this diet is recommended for you. What is my plan? Generally, a high-protein, high-calorie diet involves:  Eating 250-500 extra calories each day.  Making sure that you get enough of your daily calories from protein. Ask your health care provider how many of your calories should come from protein. Talk with a health care provider, such as a diet and nutrition specialist (dietitian), about how much protein and how many calories you need each day. Follow the diet as directed by your health care provider. What are tips for following this plan? Preparing meals  Add whole milk, half-and-half, or heavy cream to cereal, pudding, soup, or hot cocoa.  Add whole milk to instant breakfast drinks.  Add peanut butter to oatmeal or smoothies.  Add powdered milk to baked goods, smoothies, or milkshakes.  Add powdered milk, cream, or butter to mashed potatoes.  Add cheese to cooked vegetables.  Make whole-milk yogurt parfaits. Top them with granola, fruit, or nuts.  Add cottage cheese to your fruit.  Add avocado, cheese, or both to sandwiches or salads.  Add meat, poultry, or seafood to rice, pasta, casseroles, salads, and soups.  Use mayonnaise when making egg salad, chicken salad, or tuna salad.  Use peanut butter as a dip for vegetables or as a topping for pretzels, celery, or crackers.  Add beans to casseroles, dips, and spreads.  Add pureed beans to sauces and soups.  Replace calorie-free drinks with calorie-containing drinks, such as milk and fruit juice.  Replace water with milk or heavy cream when making foods such as oatmeal, pudding, or cocoa. General instructions  Ask your health care provider if you  should take a nutritional supplement.  Try to eat six small meals each day instead of three large meals.  Eat a balanced diet. In each meal, include one food that is high in protein.  Keep nutritious snacks available, such as nuts, trail mixes, dried fruit, and yogurt.  If you have kidney disease or diabetes, talk with your health care provider about how much protein is safe for you. Too much protein may put extra stress on your kidneys.  Drink your calories. Choose high-calorie drinks and have them after your meals.   What high-protein foods should I eat? Vegetables Soybeans. Peas. Grains Quinoa. Bulgur wheat. Meats and other proteins Beef, pork, and poultry. Fish and seafood. Eggs. Tofu. Textured vegetable protein (TVP). Peanut butter. Nuts and seeds. Dried beans. Protein powders. Dairy Whole milk. Whole-milk yogurt. Powdered milk. Cheese. Cottage Cheese. Eggnog. Beverages High-protein supplement drinks. Soy milk. Other foods Protein bars. The items listed above may not be a complete list of high-protein foods and beverages. Contact a dietitian for more options.   What high-calorie foods should I eat? Fruits Dried fruit. Fruit leather. Canned fruit in syrup. Fruit juice. Avocado. Vegetables Vegetables cooked in oil or butter. Fried potatoes. Grains Pasta. Quick breads. Muffins. Pancakes. Ready-to-eat cereal. Meats and other proteins Peanut butter. Nuts and seeds. Dairy Heavy cream. Whipped cream. Cream cheese. Sour cream. Ice cream. Custard. Pudding. Beverages Meal-replacement beverages. Nutrition shakes. Fruit juice. Sugar-sweetened soft drinks. Seasonings and condiments Salad dressing. Mayonnaise. Alfredo sauce. Fruit preserves or jelly. Honey. Syrup. Sweets and desserts Cake. Cookies. Pie. Pastries. Candy bars. Chocolate. Fats and oils Butter   or margarine. Oil. Gravy. Other foods Meal-replacement bars. The items listed above may not be a complete list of  high-calorie foods and beverages. Contact a dietitian for more options. Summary  A high-protein, high-calorie diet can help you gain weight or heal faster after an injury, illness, or surgery.  To increase your protein and calories, add ingredients such as whole milk, peanut butter, cheese, beans, meat, or seafood to meal items.  To get enough extra calories each day, include high-calorie foods and beverages at each meal.  Adding a high-calorie drink or shake can be an easy way to help you get enough calories each day. Talk with your healthcare provider or dietitian about the best options for you. This information is not intended to replace advice given to you by your health care provider. Make sure you discuss any questions you have with your health care provider. Document Revised: 03/05/2017 Document Reviewed: 02/02/2017 Elsevier Patient Education  2021 Elsevier Inc.  Protein Content in Foods Protein is a necessary nutrient in any diet. It helps build and repair muscles, bones, and skin. Depending on your overall health, you may need more or less protein in your diet. You are encouraged to eat a variety of protein foods to ensure that you get all the essential nutrients that are found in different protein foods. Talk to your health care provider or dietitian about how much protein you need each day and which sources of protein are best for you. Protein is especially important for:  Repairing and making cells and tissues.  Fighting infection.  Energy.  Growth and development. See the following list for the protein content of some common foods. What are tips for getting more protein in your diet?  Try to replace processed carbohydrates with high-quality protein.  Snack on nuts and seeds instead of chips.  Replace baked desserts with [place] yogurt.  Eat protein foods from both plant and animal sources.  Replace red meat with seafood choices. What foods are high in  proteins? High-protein foods contain 4 grams (g) or more of protein per serving. They include: Meat protein  Beef, ground sirloin (cooked) -- 3 oz (85 g) have 24 g of protein.  Chicken breast, boneless and skinless (cooked) -- 3 oz (85 g) have 25 g of protein.  Egg -- 1 egg has 6 g of protein.  Fish, filet (cooked) -- 1 oz (28 g) has 6-7 g of protein.  Lamb (cooked) -- 3 oz (85 g) has 24 g of protein.  Pork tenderloin (cooked) -- 3 oz (85 g) has 23 g of protein.  Tuna (canned in water) -- 3 oz (85 g) has 20 g of protein. Dairy  Cottage cheese -- 1/2 cup (114 g) has 13.4 g of protein.  Milk -- 1 cup (250 mL) has 8 g of protein.  Cheese (hard) -- 1 oz (28 g) has 7 g of protein.  Yogurt, regular -- 6 oz (170 g) has 8 g of protein.  Greek yogurt -- 6 oz (200 g) has 18 g protein. Plant protein  Garbanzo beans (canned or cooked) -- 1/2 cup (130 g) has 6-7 g of protein.  Kidney beans (canned or cooked) -- 1/2 cup (130 g) has 6-7 g of protein.  Nuts (peanuts, pistachios, almonds) -- 1 oz (28 g) has 6 g of protein.  Peanut butter -- 1 oz (32 g) has 7-8 g of protein.  Pumpkin seeds -- 1 oz (28 g) has 8.5 g of protein.  Soybeans (roasted) --  1 oz (28 g) has 8 g of protein.  Soybeans (cooked) -- 1/2 cup (90 g) has 11 g of protein.  Soy milk -- 1 cup (250 mL) has 5-10 g of protein.  Soy or vegetable patty -- 1 patty has 11 g of protein.  Sunflower seeds -- 1 oz (28 g) has 5.5 g of protein.  Tofu (firm) -- 1/2 cup (124 g) has 20 g of protein.  Tempeh -- 1/2 cup (83 g) has 16 g of protein. The items listed above may not be a complete list of foods high in protein. Actual amounts of protein may differ depending on processing. Contact a dietitian for more information.   What foods are low in protein? Low-protein foods contain 3 grams (g) or less of protein per serving. They include: Fruits  Fruit or vegetable juice -- 1/2 cup (125 mL) has 1 g of protein. Vegetables  Beets  (raw or cooked) -- 1/2 cup (68 g) has 1.5 g of protein.  Broccoli (raw or cooked) -- 1/2 cup (44 g) has 2 g of protein.  Collard greens (raw or cooked) -- 1/2 cup (42 g) has 2 g of protein.  Green beans (raw or cooked) -- 1/2 cup (83 g) has 1 g of protein.  Green peas (canned) -- 1/2 cup (80 g) has 3.5 g of protein.  Potato (baked with skin) -- 1 medium potato (173 g) has 3 g of protein.  Spinach (cooked) -- 1/2 cup (90 g) has 3 g of protein.  Squash (cooked) -- 1/2 cup (90 g) has 1.5 g of protein.  Avocado -- 1 cup (146 g) has 2.7 g of protein. Grains  Bran cereal -- 1/2 cup (30 g) has 2-3 g of protein.  Bread -- 1 slice has 2.5 g of protein.  Corn (fresh or cooked) -- 1/2 cup (77 g) has 2 g of protein.  Flour tortilla -- One 6-inch (15 cm) tortilla has 2.5 g of protein.  Muffins -- 1 small muffin (2 oz or 57 g) has 3 g of protein.  Oatmeal (cooked) -- 1/2 cup (40 g) has 3 g of protein.  Rice (cooked) -- 1/2 cup (79 g) has 2.5-3.5 g of protein. Dairy  Cream cheese -- 1 oz (29 g) has 2 g of protein.  Creamer (half-and-half) -- 1 oz (29 mL) has 1 g of protein.  Frozen yogurt -- 1/2 cup (72 g) has 3 g of protein.  Sour cream -- 1/2 cup (75 g) has 2.5 g of protein. The items listed above may not be a complete list of foods low in protein. Actual amounts of protein may differ depending on processing. Contact a dietitian for more information.   Summary  Protein is a nutrient that your body needs for growth and development, repairing and making cells and tissues, fighting infection, and providing energy.  Protein is in both plant and animal foods. Some of these foods have more protein than others.  Depending on your overall health, you may need more or less protein in your diet. Talk to your health care provider about how much protein you need. This information is not intended to replace advice given to you by your health care provider. Make sure you discuss any questions  you have with your health care provider. Document Revised: 03/22/2019 Document Reviewed: 03/22/2019 Elsevier Patient Education  2021 [employer].

## 2020-05-20 ENCOUNTER — Encounter (HOSPITAL_COMMUNITY): Payer: Self-pay | Admitting: Gastroenterology

## 2020-05-20 ENCOUNTER — Encounter (HOSPITAL_COMMUNITY)
Admission: RE | Disposition: A | Discharge status: Home / Self Care | Payer: Self-pay | Source: Home / Self Care | Attending: Gastroenterology

## 2020-05-20 ENCOUNTER — Other Ambulatory Visit: Payer: Self-pay

## 2020-05-20 ENCOUNTER — Encounter: Payer: Self-pay | Admitting: Gastroenterology

## 2020-05-20 ENCOUNTER — Ambulatory Visit (HOSPITAL_COMMUNITY)
Admission: RE | Admit: 2020-05-20 | Discharge: 2020-05-20 | Disposition: A | Discharge status: Home / Self Care | Payer: Commercial Managed Care - PPO | Attending: Gastroenterology | Admitting: Gastroenterology

## 2020-05-20 DIAGNOSIS — R1310 Dysphagia, unspecified: Secondary | ICD-10-CM | POA: Insufficient documentation

## 2020-05-20 DIAGNOSIS — R627 Adult failure to thrive: Secondary | ICD-10-CM

## 2020-05-20 DIAGNOSIS — K449 Diaphragmatic hernia without obstruction or gangrene: Secondary | ICD-10-CM

## 2020-05-20 DIAGNOSIS — K219 Gastro-esophageal reflux disease without esophagitis: Secondary | ICD-10-CM | POA: Insufficient documentation

## 2020-05-20 DIAGNOSIS — R634 Abnormal weight loss: Secondary | ICD-10-CM

## 2020-05-20 DIAGNOSIS — R142 Eructation: Secondary | ICD-10-CM | POA: Insufficient documentation

## 2020-05-20 HISTORY — PX: ESOPHAGEAL MANOMETRY: SHX5429

## 2020-05-20 SURGERY — MANOMETRY, ESOPHAGUS
Anesthesia: Choice

## 2020-05-20 MED ORDER — LIDOCAINE VISCOUS HCL 2 % MT SOLN
OROMUCOSAL | Status: AC
  Filled 2020-05-20: qty 15

## 2020-05-20 SURGICAL SUPPLY — 2 items
FACESHIELD LNG OPTICON STERILE (SAFETY) IMPLANT
GLOVE BIO SURGEON STRL SZ8 (GLOVE) ×4 IMPLANT

## 2020-05-20 NOTE — Progress Notes (Signed)
Esophageal manometry performed per protocol without complications.  Patient tolerated well. 

## 2020-05-21 ENCOUNTER — Encounter (HOSPITAL_COMMUNITY): Payer: Self-pay | Admitting: Gastroenterology

## 2020-05-23 ENCOUNTER — Other Ambulatory Visit
Admission: RE | Admit: 2020-05-23 | Discharge: 2020-05-23 | Disposition: A | Discharge status: Home / Self Care | Payer: Commercial Managed Care - PPO | Source: Home / Self Care | Attending: Gastroenterology | Admitting: Gastroenterology

## 2020-05-23 ENCOUNTER — Other Ambulatory Visit: Payer: Self-pay

## 2020-05-23 ENCOUNTER — Ambulatory Visit
Admission: RE | Admit: 2020-05-23 | Discharge: 2020-05-23 | Disposition: A | Discharge status: Home / Self Care | Payer: Commercial Managed Care - PPO | Source: Ambulatory Visit | Attending: Gastroenterology | Admitting: Gastroenterology

## 2020-05-23 DIAGNOSIS — R627 Adult failure to thrive: Secondary | ICD-10-CM | POA: Diagnosis not present

## 2020-05-23 DIAGNOSIS — R634 Abnormal weight loss: Secondary | ICD-10-CM | POA: Insufficient documentation

## 2020-05-23 LAB — CBC WITH DIFFERENTIAL/PLATELET
Abs Immature Granulocytes: 0.03 10*3/uL (ref 0.00–0.07)
Basophils Absolute: 0 10*3/uL (ref 0.0–0.1)
Basophils Relative: 0 %
Eosinophils Absolute: 0 10*3/uL (ref 0.0–0.5)
Eosinophils Relative: 1 %
HCT: 46 % (ref 39.0–52.0)
Hemoglobin: 15.1 g/dL (ref 13.0–17.0)
Immature Granulocytes: 0 %
Lymphocytes Relative: 16 %
Lymphs Abs: 1.3 10*3/uL (ref 0.7–4.0)
MCH: 28 pg (ref 26.0–34.0)
MCHC: 32.8 g/dL (ref 30.0–36.0)
MCV: 85.2 fL (ref 80.0–100.0)
Monocytes Absolute: 0.5 10*3/uL (ref 0.1–1.0)
Monocytes Relative: 6 %
Neutro Abs: 6.2 10*3/uL (ref 1.7–7.7)
Neutrophils Relative %: 77 %
Platelets: 299 10*3/uL (ref 150–400)
RBC: 5.4 MIL/uL (ref 4.22–5.81)
RDW: 15 % (ref 11.5–15.5)
WBC: 8.1 10*3/uL (ref 4.0–10.5)
nRBC: 0 % (ref 0.0–0.2)

## 2020-05-23 LAB — COMPREHENSIVE METABOLIC PANEL
ALT: 25 U/L (ref 0–44)
AST: 23 U/L (ref 15–41)
Albumin: 4.6 g/dL (ref 3.5–5.0)
Alkaline Phosphatase: 51 U/L (ref 38–126)
Anion gap: 10 (ref 5–15)
BUN: 7 mg/dL — ABNORMAL LOW (ref 8–23)
CO2: 24 mmol/L (ref 22–32)
Calcium: 9.7 mg/dL (ref 8.9–10.3)
Chloride: 99 mmol/L (ref 98–111)
Creatinine, Ser: 1 mg/dL (ref 0.61–1.24)
GFR, Estimated: >60 mL/min (ref >60)
Glucose, Bld: 109 mg/dL — ABNORMAL HIGH (ref 70–99)
Potassium: 3.7 mmol/L (ref 3.5–5.1)
Sodium: 133 mmol/L — ABNORMAL LOW (ref 135–145)
Total Bilirubin: 1 mg/dL (ref 0.3–1.2)
Total Protein: 8.4 g/dL — ABNORMAL HIGH (ref 6.5–8.1)

## 2020-05-23 LAB — HIV ANTIBODY (ROUTINE TESTING W REFLEX): HIV Screen 4th Generation wRfx: NONREACTIVE

## 2020-05-23 LAB — C-REACTIVE PROTEIN: CRP: <0.5 mg/dL (ref <1.0)

## 2020-05-23 LAB — LACTATE DEHYDROGENASE: LDH: 109 U/L (ref 98–192)

## 2020-05-23 LAB — PREALBUMIN: Prealbumin: 26.1 mg/dL (ref 18–38)

## 2020-05-23 LAB — TSH: TSH: 14.383 u[IU]/mL — ABNORMAL HIGH (ref 0.350–4.500)

## 2020-05-23 MED ORDER — CLENPIQ 10-3.5-12 MG-GM -GM/160ML PO SOLN: 320.0000 mL | Freq: Once | ORAL | 0 refills | Status: AC

## 2020-05-23 MED ORDER — IOHEXOL 300 MG/ML  SOLN
100.0000 mL | Freq: Once | INTRAMUSCULAR | Status: AC | PRN
  Administered 2020-05-23: 100 mL via INTRAVENOUS

## 2020-05-23 NOTE — Telephone Encounter (Signed)
Patient is scheduled for Modified Barium Swallow is scheduled [place] [facility] [place] can eat breakfast and a light lunch. Called to get patient scheduled for the STAT ct scan.  They need a BMP because of the BUN/creatine. Got patient scheduled for CT scan at [facility] surgery center arrived at 11:30 so he can drink the contrast. He will need lab work done before the scan   Dr. Allegra Lai wants patient to have a colonoscopy now due to weight loss because it has been more then 10 year. Per Dr. Allegra Lai. He states he will start taking the Miralax tonight. He agreed to have colonoscopy. The colonoscopy is scheduled for 06/06/2020 and COVID test 06/120. He states send prep to the pharmacy and instructions to mychart account.   Patient states he called about the test he had done on Monday and they said it would take 2 weeks to get results back. He states that when she was doing the exam she showed him on the screen that only the top door muscle and bottom door muscle was working. No other muscle in between were working

## 2020-05-23 NOTE — Progress Notes (Signed)
Discussed results of the laboratory work, imaging with patient.  TSH is elevated.  Waiting on T3 and T4.  Patient is taking Dexilant in the morning, taking thyroid medication 30 minutes after the Dexilant.  Advised him to switch Dexilant from a.m. to p.m. and to take thyroid medication in the morning empty stomach.  I have forwarded the TSH results to his PCP.  Advised him to call his PCP if he does not hear back from them tomorrow

## 2020-05-23 NOTE — Telephone Encounter (Signed)
Order the labs, CT Scan of neck chest abdominal and pelvis, Modified barium Swallow per Dr. Allegra Lai orders. Ginger is  Getting the CT scan approved then I will call get them scheduled. Will call and get the Barium swallow approved. He will go get lab work today. He states he can only eat tiny amounts. He states he has not had a bowel movement in 3 days. Please advised what you want him to do. He ask if he needed to drink a bottle of magnesium citrate tonight

## 2020-05-24 ENCOUNTER — Ambulatory Visit
Admission: RE | Admit: 2020-05-24 | Discharge: 2020-05-24 | Disposition: A | Discharge status: Home / Self Care | Payer: Commercial Managed Care - PPO | Source: Ambulatory Visit | Attending: Gastroenterology | Admitting: Gastroenterology

## 2020-05-24 ENCOUNTER — Other Ambulatory Visit: Payer: Self-pay

## 2020-05-24 DIAGNOSIS — R1310 Dysphagia, unspecified: Secondary | ICD-10-CM | POA: Diagnosis not present

## 2020-05-24 DIAGNOSIS — R1312 Dysphagia, oropharyngeal phase: Secondary | ICD-10-CM | POA: Insufficient documentation

## 2020-05-24 LAB — T4: T4, Total: 8.9 ug/dL (ref 4.5–12.0)

## 2020-05-24 LAB — T3, FREE: T3, Free: 2.5 pg/mL (ref 2.0–4.4)

## 2020-05-24 NOTE — Progress Notes (Signed)
Modified Barium Swallow Progress Note  Patient Details  Name: Thomas Dickson MRN: [id number] Date of Birth: [birth year]  Today's Date: 05/24/2020  Modified Barium Swallow completed.  Full report located under Chart Review in the Imaging Section.  Brief recommendations include the following:  Clinical Impression  Pt presents with adequate oropharyngeal abilities to consume a regular diet with thin liquids and minimal risk of aspiration. During this study, pt consumed thin liquids via cup and straw, large boluses, small boluses, puree, graham crackers and barium table whole with thin liquids via straw. Pt was free of any overt s/s of aspiration and none were observed under fluro. Video feedback was provided but despite this, pt continued to have sensation of "something being stuck at my adam's apple." At this time, pt is appropriate to consume regular food with thin liquids and medicine whole with thin liquids. Extensive education provided to pt on his functional oropharyngeal abilities. Recommend pt seek out help with severe fear that he reports when eating.   Swallow Evaluation Recommendations       SLP Diet Recommendations: Regular solids;Thin liquid   Liquid Administration via: Straw;Cup   Medication Administration: Whole meds with liquid   Supervision: Patient able to self feed   Compensations: Minimize environmental distractions;Slow rate;Small sips/bites   Postural Changes: Remain semi-upright after after feeds/meals (Comment);Seated upright at 90 degrees   Oral Care Recommendations: Oral care BID       Aric Jost B. Dreama Saa M.S., CCC-SLP, [facility] Speech-Language Pathologist Rehabilitation Services Office [phone]  Reuel Derby 05/24/2020,3:20 PM

## 2020-05-27 ENCOUNTER — Other Ambulatory Visit: Payer: Self-pay | Admitting: Gastroenterology

## 2020-05-27 ENCOUNTER — Encounter: Payer: Self-pay | Admitting: Gastroenterology

## 2020-05-27 DIAGNOSIS — E069 Thyroiditis, unspecified: Secondary | ICD-10-CM

## 2020-05-27 MED ORDER — PREDNISONE 10 MG PO TABS: ORAL_TABLET | ORAL | 0 refills | Status: AC

## 2020-05-27 NOTE — Telephone Encounter (Signed)
I think it is likely from the contrast  RV

## 2020-05-28 NOTE — Telephone Encounter (Signed)
Thomas Dickson, you can forward this to him via MyChart   He can take Pepcid as needed but not along with thyroid medication, can take it daily also  Increase MiraLAX to twice daily, I do not recommend magnesium citrate more than once a week  Thanks RV

## 2020-05-29 NOTE — Telephone Encounter (Signed)
What do you want me to tell him. He wrote something today

## 2020-05-30 NOTE — Telephone Encounter (Signed)
If he still having burning sensation in his throat, he can try over-the-counter Mylanta as needed  RV

## 2020-06-03 ENCOUNTER — Telehealth: Payer: Self-pay

## 2020-06-03 NOTE — Telephone Encounter (Signed)
Can take magnesium citrate today  RV

## 2020-06-03 NOTE — Telephone Encounter (Signed)
Patient verbalized understanding  

## 2020-06-03 NOTE — Telephone Encounter (Signed)
Patient called to see if he needed to go for COVID 19 test because he tested postive on 03/21/2021. Informed patient he does not.. informed Rosann Auerbach results are in care every where   Patient states he has been taking Miralax every day since Wednesday. He states that he took Magnesium citrate last on Tuesday. He states on Wednesday he had 7 bowel movements but has not went since but has been taking Miralax every day. Please advise what you recommend

## 2020-06-04 ENCOUNTER — Other Ambulatory Visit: Payer: Commercial Managed Care - PPO

## 2020-06-04 ENCOUNTER — Other Ambulatory Visit: Payer: Self-pay

## 2020-06-04 DIAGNOSIS — R627 Adult failure to thrive: Secondary | ICD-10-CM

## 2020-06-04 DIAGNOSIS — R1310 Dysphagia, unspecified: Secondary | ICD-10-CM

## 2020-06-04 DIAGNOSIS — K219 Gastro-esophageal reflux disease without esophagitis: Secondary | ICD-10-CM

## 2020-06-04 DIAGNOSIS — K449 Diaphragmatic hernia without obstruction or gangrene: Secondary | ICD-10-CM

## 2020-06-04 DIAGNOSIS — R634 Abnormal weight loss: Secondary | ICD-10-CM

## 2020-06-04 MED ORDER — ONDANSETRON HCL 4 MG PO TABS: 4.0000 mg | ORAL_TABLET | Freq: Three times a day (TID) | ORAL | 0 refills | Status: AC | PRN

## 2020-06-04 NOTE — Telephone Encounter (Signed)
He can drink the prep slowly and send in Zofran in case if he feels nauseous.  He cannot have any applesauce nor he cannot chew gum. He can only have sips of water with medications that he is supposed to take in the morning like his blood pressure pill and his thyroid medication.  He can skip prednisone dose in the morning of the procedure.  He can restart that evening  RV

## 2020-06-04 NOTE — Telephone Encounter (Signed)
Informed patient by mychart  ?

## 2020-06-04 NOTE — Progress Notes (Signed)
Sent zofran per Dr. Allegra Lai orders

## 2020-06-05 ENCOUNTER — Encounter: Payer: Self-pay | Admitting: Gastroenterology

## 2020-06-06 ENCOUNTER — Ambulatory Visit
Admission: RE | Admit: 2020-06-06 | Discharge: 2020-06-06 | Disposition: A | Discharge status: Home / Self Care | Payer: Commercial Managed Care - PPO | Attending: Gastroenterology | Admitting: Gastroenterology

## 2020-06-06 ENCOUNTER — Ambulatory Visit: Payer: Commercial Managed Care - PPO | Admitting: Anesthesiology

## 2020-06-06 ENCOUNTER — Encounter
Admission: RE | Disposition: A | Discharge status: Home / Self Care | Payer: Self-pay | Source: Home / Self Care | Attending: Gastroenterology

## 2020-06-06 ENCOUNTER — Encounter: Payer: Self-pay | Admitting: Gastroenterology

## 2020-06-06 DIAGNOSIS — Z886 Allergy status to analgesic agent status: Secondary | ICD-10-CM | POA: Diagnosis not present

## 2020-06-06 DIAGNOSIS — K644 Residual hemorrhoidal skin tags: Secondary | ICD-10-CM | POA: Diagnosis not present

## 2020-06-06 DIAGNOSIS — K5730 Diverticulosis of large intestine without perforation or abscess without bleeding: Secondary | ICD-10-CM | POA: Insufficient documentation

## 2020-06-06 DIAGNOSIS — K648 Other hemorrhoids: Secondary | ICD-10-CM | POA: Insufficient documentation

## 2020-06-06 DIAGNOSIS — Z8616 Personal history of COVID-19: Secondary | ICD-10-CM | POA: Insufficient documentation

## 2020-06-06 DIAGNOSIS — Z79899 Other long term (current) drug therapy: Secondary | ICD-10-CM | POA: Diagnosis not present

## 2020-06-06 DIAGNOSIS — R634 Abnormal weight loss: Secondary | ICD-10-CM | POA: Diagnosis not present

## 2020-06-06 DIAGNOSIS — Z79890 Hormone replacement therapy (postmenopausal): Secondary | ICD-10-CM | POA: Insufficient documentation

## 2020-06-06 DIAGNOSIS — Z87891 Personal history of nicotine dependence: Secondary | ICD-10-CM | POA: Diagnosis not present

## 2020-06-06 HISTORY — PX: COLONOSCOPY WITH PROPOFOL: SHX5780

## 2020-06-06 SURGERY — COLONOSCOPY WITH PROPOFOL
Anesthesia: General

## 2020-06-06 MED ORDER — SODIUM CHLORIDE 0.9 % IV SOLN: INTRAVENOUS | Status: DC

## 2020-06-06 MED ORDER — PROPOFOL 500 MG/50ML IV EMUL
INTRAVENOUS | Status: AC
  Filled 2020-06-06: qty 50

## 2020-06-06 MED ORDER — PHENYLEPHRINE HCL (PRESSORS) 10 MG/ML IV SOLN
INTRAVENOUS | Status: AC
  Filled 2020-06-06: qty 1

## 2020-06-06 MED ORDER — PROPOFOL 500 MG/50ML IV EMUL
INTRAVENOUS | Status: DC | PRN
  Administered 2020-06-06: 120 ug/kg/min via INTRAVENOUS

## 2020-06-06 MED ORDER — PHENYLEPHRINE HCL (PRESSORS) 10 MG/ML IV SOLN
INTRAVENOUS | Status: DC | PRN
  Administered 2020-06-06 (×2): 50 ug via INTRAVENOUS

## 2020-06-06 NOTE — Anesthesia Procedure Notes (Signed)
Performed by: Cook-Martin, Mitsuko Luera Pre-anesthesia Checklist: Patient identified, Emergency Drugs available, Suction available, Patient being monitored and Timeout performed Patient Re-evaluated:Patient Re-evaluated prior to induction Oxygen Delivery Method: Nasal cannula Preoxygenation: Pre-oxygenation with 100% oxygen Induction Type: IV induction Placement Confirmation: positive ETCO2 and CO2 detector       

## 2020-06-06 NOTE — Op Note (Signed)
[facility] Patient Name: Thomas Dickson Procedure Date: 06/06/2020 11:13 AM MRN: [id number] Account #: [id number] Date of Birth: October 29, 1960 Admit Type: Outpatient Age: 60 Room: [facility] ENDO ROOM 4 Gender: Male Note Status: Finalized Procedure:             Colonoscopy Indications:           Weight loss Providers:             Toney Reil MD, MD Referring MD:          Caryl Asp (Referring MD) Medicines:             General Anesthesia Complications:         No immediate complications. Estimated blood loss: None. Procedure:             Pre-Anesthesia Assessment:                        - Prior to the procedure, a History and Physical was                         performed, and patient medications and allergies were                         reviewed. The patient is competent. The risks and                         benefits of the procedure and the sedation options and                         risks were discussed with the patient. All questions                         were answered and informed consent was obtained.                         Patient identification and proposed procedure were                         verified by the physician, the nurse, the                         anesthesiologist, the anesthetist and the technician                         in the pre-procedure area in the procedure room in the                         endoscopy suite. Mental Status Examination: alert and                         oriented. Airway Examination: normal oropharyngeal                         airway and neck mobility. Respiratory Examination:                         clear to auscultation. CV Examination: normal.  Prophylactic Antibiotics: The patient does not require                         prophylactic antibiotics. Prior Anticoagulants: The                         patient has taken no previous anticoagulant or                          antiplatelet agents. ASA Grade Assessment: II - A                         patient with mild systemic disease. After reviewing                         the risks and benefits, the patient was deemed in                         satisfactory condition to undergo the procedure. The                         anesthesia plan was to use general anesthesia.                         Immediately prior to administration of medications,                         the patient was re-assessed for adequacy to receive                         sedatives. The heart rate, respiratory rate, oxygen                         saturations, blood pressure, adequacy of pulmonary                         ventilation, and response to care were monitored                         throughout the procedure. The physical status of the                         patient was re-assessed after the procedure.                        After obtaining informed consent, the colonoscope was                         passed under direct vision. Throughout the procedure,                         the patient's blood pressure, pulse, and oxygen                         saturations were monitored continuously. The                         Colonoscope was introduced through the anus and  advanced to the the terminal ileum, with                         identification of the appendiceal orifice and IC                         valve. The colonoscopy was performed without                         difficulty. The patient tolerated the procedure well.                         The quality of the bowel preparation was adequate. Findings:      The perianal and digital rectal examinations were normal. Pertinent       negatives include normal sphincter tone and no palpable rectal lesions.      The terminal ileum appeared normal.      Multiple diverticula were found in the sigmoid colon and descending       colon.      External and internal  hemorrhoids were found during retroflexion. The       hemorrhoids were medium-sized.      The exam was otherwise without abnormality.      Normal mucosa was found in the entire colon. Impression:            - The examined portion of the ileum was normal.                        - Diverticulosis in the sigmoid colon and in the                         descending colon.                        - External and internal hemorrhoids.                        - The examination was otherwise normal.                        - Normal mucosa in the entire examined colon.                        - No specimens collected. Recommendation:        - Discharge patient to home (with escort).                        - Resume previous diet today.                        - Continue present medications.                        - Repeat colonoscopy in 10 years for screening                         purposes. Procedure Code(s):     --- Professional ---                        [id number], Colonoscopy, flexible; diagnostic, including  collection of specimen(s) by brushing or washing, when                         performed (separate procedure) Diagnosis Code(s):     --- Professional ---                        K64.8, Other hemorrhoids                        R63.4, Abnormal weight loss                        K57.30, Diverticulosis of large intestine without                         perforation or abscess without bleeding CPT copyright 2019 American Medical Association. All rights reserved. The codes documented in this report are preliminary and upon coder review may  be revised to meet current compliance requirements. Dr. Libby Maw Toney Reil MD, MD 06/06/2020 11:43:43 AM This report has been signed electronically. Number of Addenda: 0 Note Initiated On: 06/06/2020 11:13 AM Scope Withdrawal Time: 0 hours 10 minutes 0 seconds  Total Procedure Duration: 0 hours 16 minutes 21 seconds  Estimated Blood  Loss:  Estimated blood loss: none.      [facility]

## 2020-06-06 NOTE — Transfer of Care (Signed)
Immediate Anesthesia Transfer of Care Note  Patient: Thomas Dickson  Procedure(s) Performed: COLONOSCOPY WITH PROPOFOL (N/A )  Patient Location: PACU  Anesthesia Type:General  Level of Consciousness: awake and sedated  Airway & Oxygen Therapy: Patient Spontanous Breathing and Patient connected to nasal cannula oxygen  Post-op Assessment: Report given to RN and Post -op Vital signs reviewed and stable  Post vital signs: Reviewed and stable  Last Vitals:  Vitals Value Taken Time  BP    Temp    Pulse    Resp    SpO2      Last Pain:  Vitals:   06/06/20 1036  TempSrc: Temporal  PainSc: 0-No pain         Complications: No complications documented.

## 2020-06-06 NOTE — Anesthesia Preprocedure Evaluation (Addendum)
Anesthesia Evaluation  Patient identified by MRN, date of birth, ID band Patient awake    Reviewed: Allergy & Precautions, NPO status , Patient's Chart, lab work & pertinent test results, reviewed documented beta blocker date and time   History of Anesthesia Complications Negative for: history of anesthetic complications  Airway Mallampati: I       Dental   Pulmonary neg sleep apnea, neg COPD, Not current smoker, former smoker,           Cardiovascular hypertension, Pt. on medications and Pt. on home beta blockers (-) Past MI and (-) CHF (-) dysrhythmias (-) Valvular Problems/Murmurs     Neuro/Psych neg Seizures    GI/Hepatic Neg liver ROS, GERD  Medicated,  Endo/Other  neg diabetesHypothyroidism   Renal/GU negative Renal ROS     Musculoskeletal   Abdominal   Peds  Hematology   Anesthesia Other Findings   Reproductive/Obstetrics                            Anesthesia Physical Anesthesia Plan  ASA: II  Anesthesia Plan: General   Post-op Pain Management:    Induction: Intravenous  PONV Risk Score and Plan: 2 and Propofol infusion and TIVA  Airway Management Planned: Nasal Cannula  Additional Equipment:   Intra-op Plan:   Post-operative Plan:   Informed Consent: I have reviewed the patients History and Physical, chart, labs and discussed the procedure including the risks, benefits and alternatives for the proposed anesthesia with the patient or authorized representative who has indicated his/her understanding and acceptance.       Plan Discussed with:   Anesthesia Plan Comments:         Anesthesia Quick Evaluation

## 2020-06-06 NOTE — H&P (Signed)
Arlyss Repress, MD [address]  [address]  [place], [place] [address]  Main: [phone]  Fax: [phone] Pager: [phone]  Primary Care Physician:  Myrene Buddy, NP Primary Gastroenterologist:  Dr. Arlyss Repress  Pre-Procedure History & Physical: HPI:  Thomas Dickson is a 60 y.o. male is here for an colonoscopy.   Past Medical History:  Diagnosis Date  . COVID-19 03/21/2020  . Hypertension   . Hypothyroidism   . Thyroid disease     Past Surgical History:  Procedure Laterality Date  . ABDOMINAL HERNIA REPAIR     2006  . ESOPHAGEAL MANOMETRY N/A 05/20/2020   Procedure: ESOPHAGEAL MANOMETRY (EM);  Surgeon: Napoleon Form, MD;  Location: [facility] ENDOSCOPY;  Service: Endoscopy;  Laterality: N/A;  . ESOPHAGOGASTRODUODENOSCOPY (EGD) WITH PROPOFOL N/A 04/11/2020   Procedure: ESOPHAGOGASTRODUODENOSCOPY (EGD) WITH PROPOFOL;  Surgeon: Toney Reil, MD;  Location: [facility] ENDOSCOPY;  Service: Gastroenterology;  Laterality: N/A;  . hemmorhoid    . HEMORRHOID SURGERY    . HERNIA REPAIR      Prior to Admission medications   Medication Sig Start Date End Date Taking? Authorizing Provider  dexlansoprazole (DEXILANT) 60 MG capsule Take 1 capsule (60 mg total) by mouth daily. 05/14/20   Toney Reil, MD  levothyroxine (SYNTHROID, LEVOTHROID) 75 MCG tablet Take 75 mcg by mouth daily before breakfast.    [provider]  metoprolol succinate (TOPROL-XL) 50 MG 24 hr tablet Take 50 mg by mouth daily. Take with or immediately following a meal.    [provider]  ondansetron (ZOFRAN) 4 MG tablet Take 1 tablet (4 mg total) by mouth every 8 (eight) hours as needed for nausea or vomiting. 06/04/20   Toney Reil, MD  predniSONE (DELTASONE) 10 MG tablet Take 2 tablets (20 mg total) by mouth daily with breakfast for 7 days, THEN 1 tablet (10 mg total) daily with breakfast for 5 days, THEN 0.5 tablets (5 mg total) daily with breakfast for 6 days.  05/27/20 06/14/20  Toney Reil, MD  valsartan (DIOVAN) 40 MG tablet Take 40 mg by mouth daily. 01/16/20   [provider]  omeprazole (PRILOSEC) 40 MG capsule Take 40 mg by mouth daily. 04/02/20 05/14/20  [provider]    Allergies as of 05/23/2020 - Review Complete 05/20/2020  Allergen Reaction Noted  . Aspirin Other (See Comments) 09/09/2014  . Sulfa antibiotics Other (See Comments) 09/09/2014    Family History  Problem Relation Age of Onset  . Diabetes Mother   . Cancer Mother   . Cancer Father   . Thyroid disease Sister   . Cancer Sister   . Diabetes Brother   . Thyroid disease Brother     Social History   Socioeconomic History  . Marital status: Married    Spouse name: Not on file  . Number of children: Not on file  . Years of education: Not on file  . Highest education level: Not on file  Occupational History  . Not on file  Tobacco Use  . Smoking status: Former Smoker    Types: Cigarettes    Quit date: 1994    Years since quitting: 28.1  . Smokeless tobacco: Never Used  Vaping Use  . Vaping Use: Never used  Substance and Sexual Activity  . Alcohol use: No  . Drug use: No  . Sexual activity: Not on file  Other Topics Concern  . Not on file  Social History Narrative  . Not  on file   Social Determinants of Health   Financial Resource Strain: Not on file  Food Insecurity: Not on file  Transportation Needs: Not on file  Physical Activity: Not on file  Stress: Not on file  Social Connections: Not on file  Intimate Partner Violence: Not on file    Review of Systems: See HPI, otherwise negative ROS  Physical Exam: BP (!) 168/105   Pulse 91   Temp (!) 97.3 F (36.3 C) (Temporal)   Resp 18   Ht 5\' 10"  (1.778 m)   Wt 65.3 kg   SpO2 100%   BMI 20.66 kg/m  General:   Alert,  pleasant and cooperative in NAD Head:  Normocephalic and atraumatic. Neck:  Supple; no masses or thyromegaly. Lungs:  Clear throughout to  auscultation.    Heart:  Regular rate and rhythm. Abdomen:  Soft, nontender and nondistended. Normal bowel sounds, without guarding, and without rebound.   Neurologic:  Alert and  oriented x4;  grossly normal neurologically.  Impression/Plan: is here for an colonoscopy to be performed for weight loss  Risks, benefits, limitations, and alternatives regarding  colonoscopy have been reviewed with the patient.  Questions have been answered.  All parties agreeable.   Brayton El, MD  06/06/2020, 11:12 AM

## 2020-06-07 NOTE — Anesthesia Postprocedure Evaluation (Signed)
Anesthesia Post Note  Patient: Thomas Dickson  Procedure(s) Performed: COLONOSCOPY WITH PROPOFOL (N/A )  Patient location during evaluation: PACU Anesthesia Type: General Level of consciousness: awake and alert Pain management: pain level controlled Vital Signs Assessment: post-procedure vital signs reviewed and stable Respiratory status: spontaneous breathing and respiratory function stable Cardiovascular status: stable Anesthetic complications: no   No complications documented.   Last Vitals:  Vitals:   06/06/20 1155 06/06/20 1205  BP: 111/67 108/70  Pulse: 72 68  Resp: 16 16  Temp:    SpO2: 100% 100%    Last Pain:  Vitals:   06/06/20 1205  TempSrc:   PainSc: 0-No pain                 KEPHART,WILLIAM K

## 2020-06-12 NOTE — Telephone Encounter (Signed)
Placed orders

## 2020-06-22 ENCOUNTER — Other Ambulatory Visit: Payer: Self-pay | Admitting: Gastroenterology

## 2020-07-10 ENCOUNTER — Other Ambulatory Visit: Payer: Self-pay | Admitting: Gastroenterology

## 2020-07-10 DIAGNOSIS — K5904 Chronic idiopathic constipation: Secondary | ICD-10-CM

## 2020-07-10 MED ORDER — LINACLOTIDE 145 MCG PO CAPS: 145.0000 ug | ORAL_CAPSULE | Freq: Every day | ORAL | 0 refills | Status: DC

## 2020-07-11 ENCOUNTER — Telehealth: Payer: Self-pay | Admitting: *Deleted

## 2020-07-11 NOTE — Telephone Encounter (Signed)
We do not have any more of the [employer] copay cards. Informed patient if he goggle [employer] copay  card he can fill out the information and they will send the copay card to his e-mail. He states he will do this

## 2020-07-11 NOTE — Telephone Encounter (Signed)
Patient LVM about Dr. Allegra Lai giving him a Rx copay card. Patient can be reached at [phone]

## 2020-08-31 ENCOUNTER — Other Ambulatory Visit: Payer: Self-pay | Admitting: Gastroenterology

## 2020-09-11 ENCOUNTER — Encounter: Payer: Self-pay | Admitting: Gastroenterology

## 2020-09-16 ENCOUNTER — Telehealth: Payer: Self-pay | Admitting: Gastroenterology

## 2020-09-16 NOTE — Telephone Encounter (Signed)
Call patient about RX

## 2020-09-16 NOTE — Telephone Encounter (Signed)
Patient states he is going to get a second opinion at [facility] on 10/30/2020. He states he did receive a letter from his insurance company stating July 1st insurance is not going to cover the Dexilant anymore. They want him to try Lansoprazole, Omeprazole, Pantoprazole

## 2020-09-17 MED ORDER — OMEPRAZOLE 40 MG PO CPDR
40.0000 mg | DELAYED_RELEASE_CAPSULE | Freq: Two times a day (BID) | ORAL | 0 refills | Status: DC
Start: 2020-09-17 — End: 2020-10-25

## 2020-09-17 NOTE — Addendum Note (Signed)
Addended by: Radene Knee L on: 09/17/2020 10:38 AM   Modules accepted: Orders

## 2020-09-17 NOTE — Telephone Encounter (Signed)
He can try omeprazole 40 mg p.o. twice daily Before meals 30-day supply  RV

## 2020-09-17 NOTE — Telephone Encounter (Signed)
Patient verbalized understanding of instructions and sent medication to the pharmacy  

## 2020-10-05 ENCOUNTER — Other Ambulatory Visit: Payer: Self-pay | Admitting: Gastroenterology

## 2020-10-05 DIAGNOSIS — K5904 Chronic idiopathic constipation: Secondary | ICD-10-CM

## 2020-10-25 ENCOUNTER — Other Ambulatory Visit: Payer: Self-pay | Admitting: Gastroenterology

## 2020-10-25 MED ORDER — OMEPRAZOLE 40 MG PO CPDR: 40.0000 mg | DELAYED_RELEASE_CAPSULE | Freq: Two times a day (BID) | ORAL | 0 refills | Status: AC

## 2020-11-21 ENCOUNTER — Other Ambulatory Visit: Payer: Self-pay | Admitting: Gastroenterology

## 2022-12-28 IMAGING — CT CT CHEST-ABD-PELV W/ CM
2 of 3 series · 12 of 31 positions shown, 18 images · IV contrast (omnipaque)
Comparison: 09/10/2004

CLINICAL DATA: Failure to thrive. Loss of weight. Severe muscle
weakness.

EXAM:
CT CHEST, ABDOMEN, AND PELVIS WITH CONTRAST
TECHNIQUE: Multidetector CT imaging of the chest, abdomen and pelvis was
performed following the standard protocol during bolus
administration of intravenous contrast.
CONTRAST:  100mL OMNIPAQUE IOHEXOL 300 MG/ML  SOLN

[Series 6: cap with · axial · 0.77mm/px · z∈[-884,-359]mm · 9 of 136 slices shown, 15 images]
[im 16/136  mediastinal]
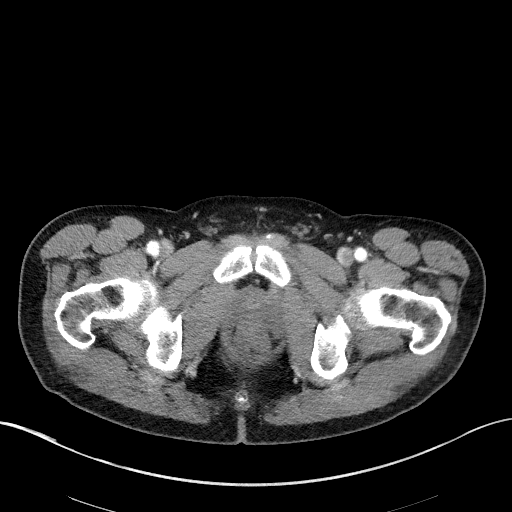
[im 16/136  bone]
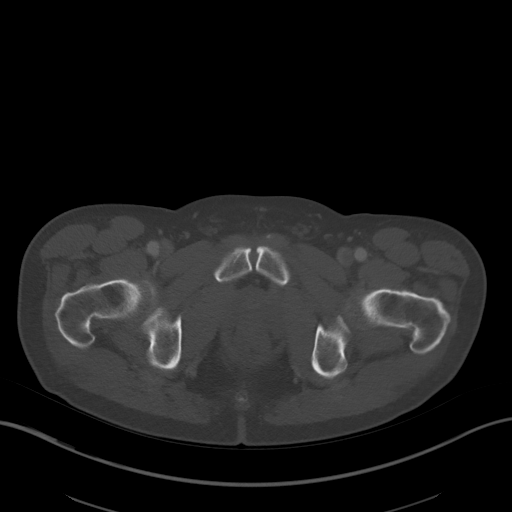
[im 31/136  mediastinal]
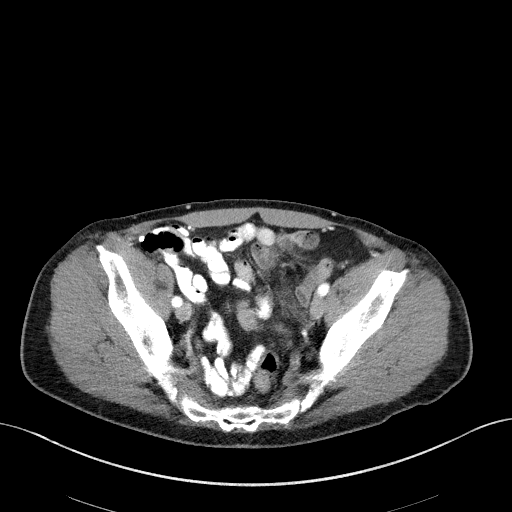
[im 46/136  mediastinal]
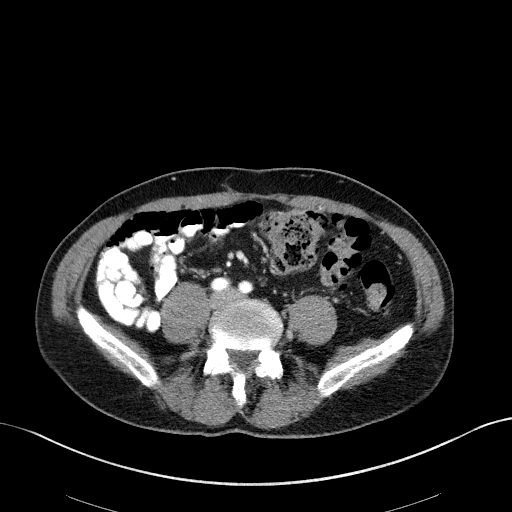
[im 61/136  mediastinal]
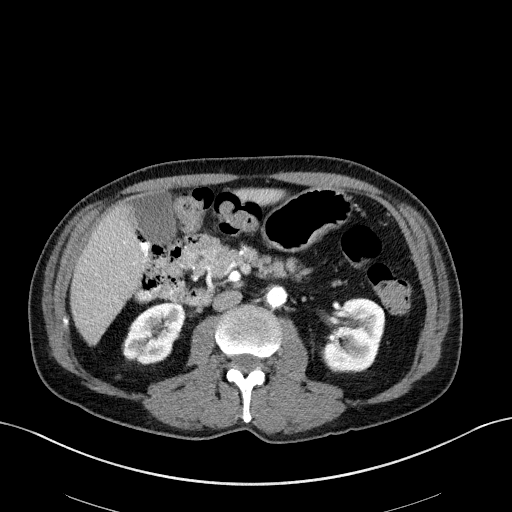
[im 67/136  mediastinal]
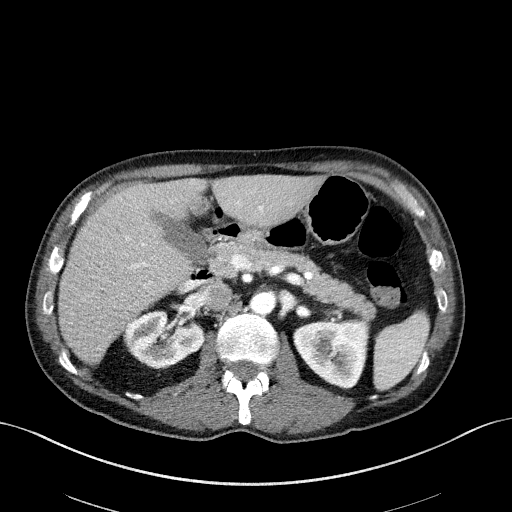
[im 76/136  mediastinal]
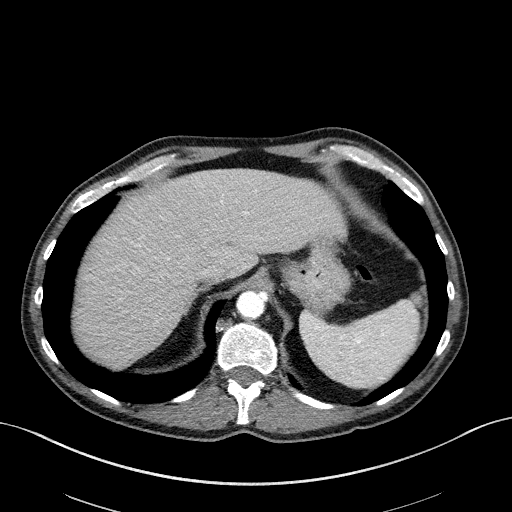
[im 76/136  lung]
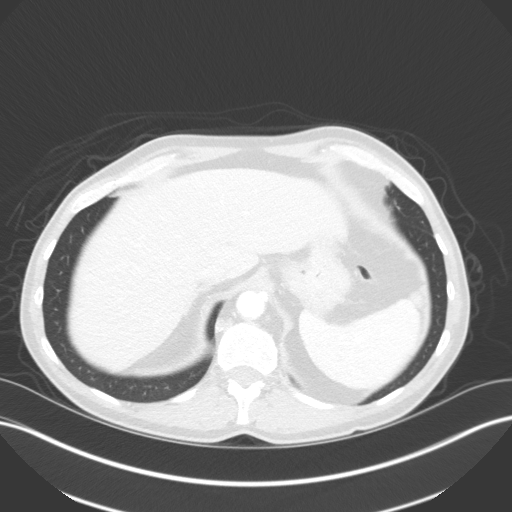
[im 91/136  mediastinal]
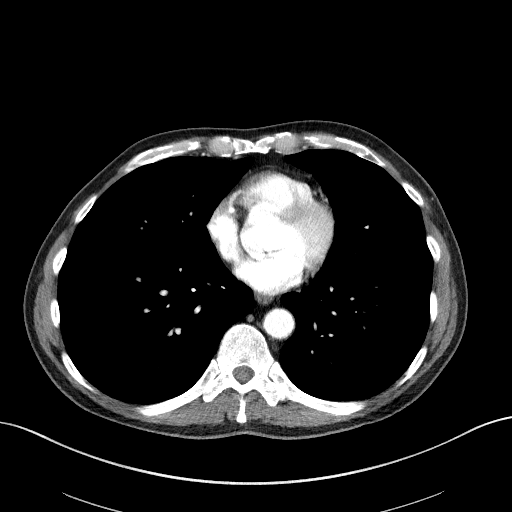
[im 91/136  lung]
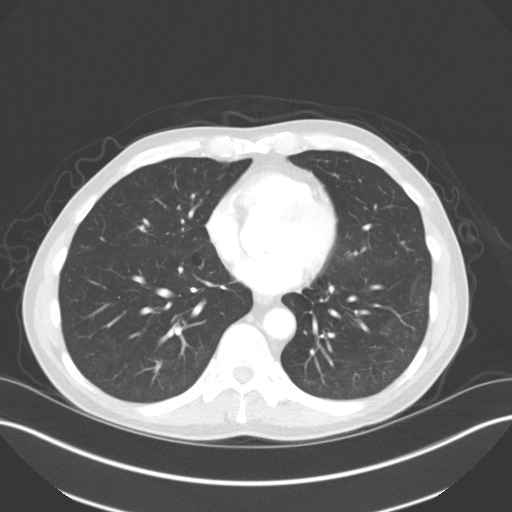
[im 106/136  mediastinal]
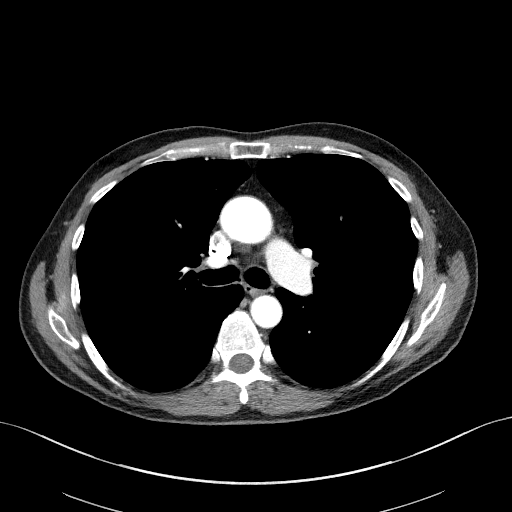
[im 106/136  lung]
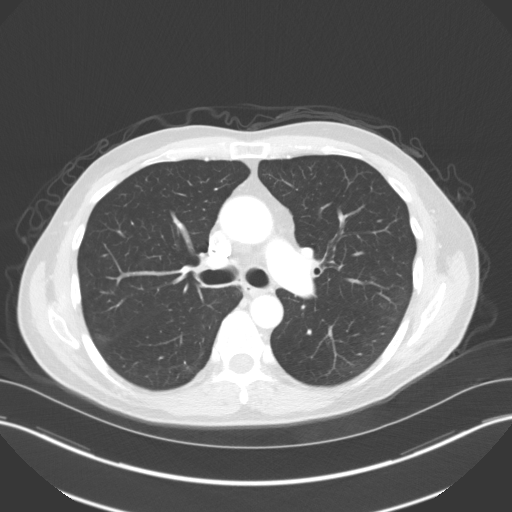
[im 121/136  mediastinal]
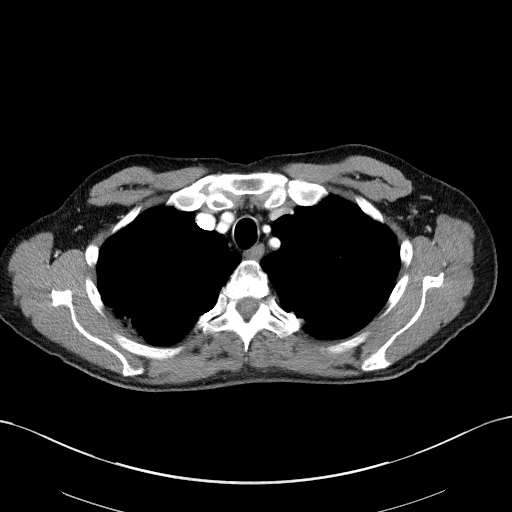
[im 121/136  lung]
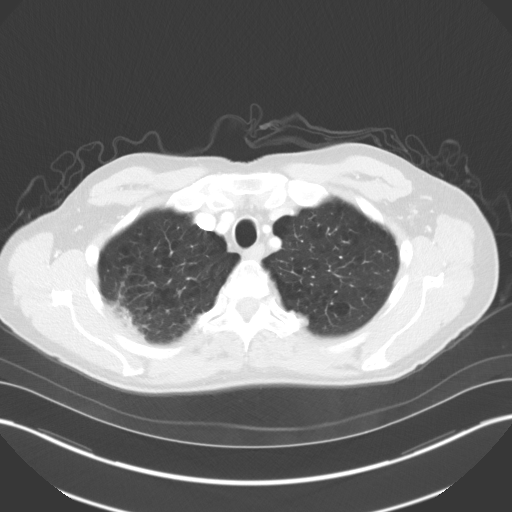
[im 121/136  bone]
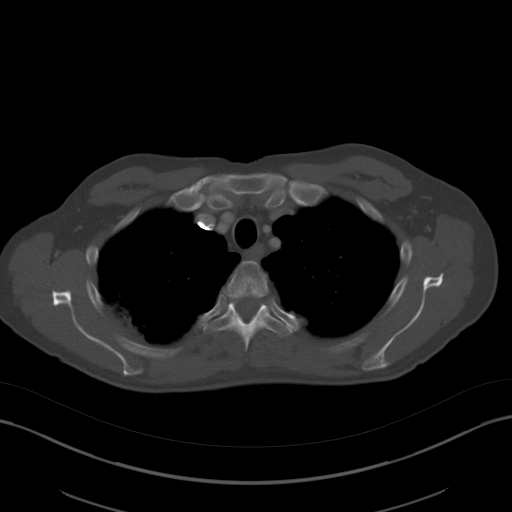

[Series 7: lung · axial · 0.77mm/px · z∈[-604,-498]mm · 3 of 174 slices shown]
[im 14/174  bone]
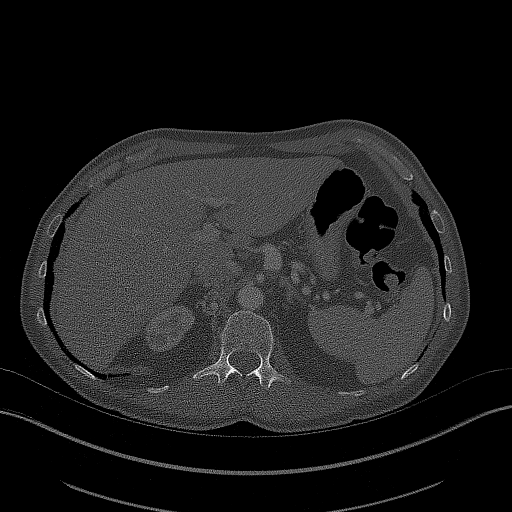
[im 40/174  bone]
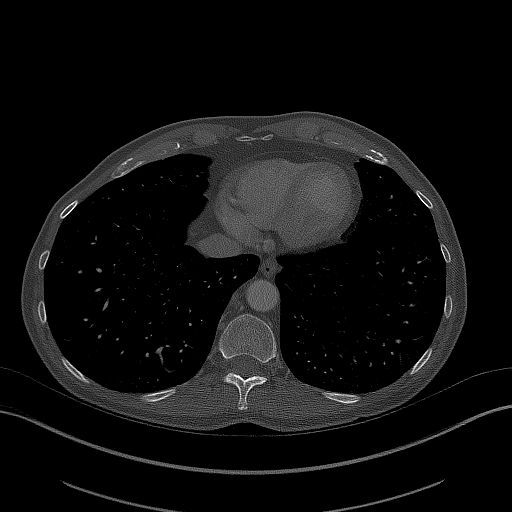
[im 67/174  bone]
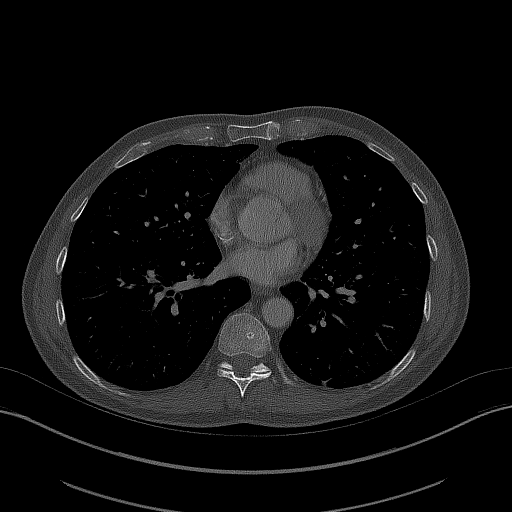

[12 of 31 positions shown; findings below may reference images not displayed]

FINDINGS: CT CHEST FINDINGS

Cardiovascular: The heart size is normal.  No pericardial effusion.

Mediastinum/Nodes: No enlarged mediastinal, hilar, or axillary lymph
nodes. Thyroid gland, trachea, and esophagus demonstrate no
significant findings.

Lungs/Pleura: Moderate to severe centrilobular and paraseptal
emphysema. No airspace consolidation, atelectasis or
pneumothoraxBiapical pleuroparenchymal scarring is identified.
Subpleural nodule in the superior segment of left lower lobe
measures 4 mm, image 60/7.

Musculoskeletal: No chest wall mass or suspicious bone lesions
identified.

CT ABDOMEN PELVIS FINDINGS

Hepatobiliary: No focal liver abnormality identified. Gallbladder
unremarkable. No biliary dilatation.

Pancreas: Unremarkable. No pancreatic ductal dilatation or
surrounding inflammatory changes.

Spleen: Simple appearing cyst arises off the inferior pole of the
spleen measuring 1.4 cm. Spleen is otherwise normal in size and
appearance.

Adrenals/Urinary Tract: Normal adrenal glands. Scarring and volume
loss is noted involving the upper and lower pole of the right
kidney. No kidney mass or hydronephrosis identified bilaterally.
Urinary bladder is unremarkable.

Stomach/Bowel: Stomach is normal. The duodenum does not cross the
midline. Small bowel loops are largely contained within the right
abdomen. The cecum is in the left lower quadrant of the abdomen. The
appendix is visualized and appears normal. No bowel wall thickening,
inflammation, or distension. Left-sided colonic diverticulosis
noted. No convincing evidence for acute diverticulitis.

Vascular/Lymphatic: Mild aortic atherosclerosis. No aneurysm. No
abdominopelvic adenopathy.

Reproductive: Mild prostate gland enlargement.

Other: No free fluid or fluid collections. Previous right inguinal
herniorrhaphy.

Musculoskeletal: Benign appearing sclerotic lesion in the right
iliac bone likely represents a bone island, image 101/6. No acute or
suspicious osseous findings.
IMPRESSION: 1. No acute findings identified within the chest, abdomen or pelvis.
No mass or adenopathy identified to suggest malignancy.
2. Congenital small bowel malrotation deformity is identified. No
signs of midgut volvulus or bowel obstruction.
3. Left-sided colonic diverticulosis without convincing evidence for
acute diverticulitis.
4. Emphysema and aortic atherosclerosis.
5. Subpleural nodule within the left lower lobe measures 4 mm. No
follow-up needed if patient is low-risk. Non-contrast chest CT can
be considered in 12 months if patient is high-risk. This
recommendation follows the consensus statement: Guidelines for
Management of Incidental Pulmonary Nodules Detected on CT Images:

Aortic Atherosclerosis (ME26K-4J2.2) and Emphysema (ME26K-Z3B.R).

## 2023-12-25 ENCOUNTER — Ambulatory Visit: Admission: EM | Admit: 2023-12-25 | Discharge: 2023-12-25 | Disposition: A | Discharge status: Home / Self Care

## 2023-12-25 DIAGNOSIS — H53132 Sudden visual loss, left eye: Secondary | ICD-10-CM

## 2023-12-25 HISTORY — DX: Allergy to mammalian meats: Z91.014

## 2023-12-25 NOTE — ED Triage Notes (Signed)
 R:20/40 L:20/200 B: 20/50 With out correction  Patient staets that he was driving last night and noticed a black wiggly line in his left eye. Patient states that when he wok eup this morning it looks like he's looking through a spider web. Left eye

## 2023-12-25 NOTE — ED Provider Notes (Signed)
 [facility]    CSN: [id number] Arrival date & time: 12/25/23  9164      History   Chief Complaint No chief complaint on file.   HPI Thomas Dickson is a 63 y.o. male.   Patient presents today with a 15-hour history of vision loss in his left eye.  He reports that yesterday he was driving to get food for his grandchildren when he felt a dark area in his peripheral vision.  When he would move his head this would improve.  He then developed a floater which he described as dark squiggly line and then when he woke up this morning he felt like he was looking through glass and still had the spots in his vision.  He denies any associated pain, erythema of the eye, abnormal drainage.  He denies any fever, headache, nausea, vomiting.  Denies any recent ocular injury.  He does not wear glasses or contacts regularly.  He was seen by a local franchise optometrist ([facility] over a year ago and got reading glasses but has not seen an ophthalmologist in many years.  He denies previous surgery involving his eye.    Past Medical History:  Diagnosis Date   Alpha-gal syndrome    COVID-19 03/21/2020   Hypertension    Hypothyroidism    Thyroid  disease     Patient Active Problem List   Diagnosis Date Noted   Unexplained weight loss    Gastroesophageal reflux disease    Hiatal hernia    Esophageal dysphagia    Hypertension 04/08/2020   Hypothyroidism, acquired 04/08/2020   Refusal of statin medication by patient 04/08/2020   IGT (impaired glucose tolerance) 05/07/2018    Past Surgical History:  Procedure Laterality Date   ABDOMINAL HERNIA REPAIR     2006   COLONOSCOPY WITH PROPOFOL  N/A 06/06/2020   Procedure: COLONOSCOPY WITH PROPOFOL ;  Surgeon: Unk Corinn Skiff, MD;  Location: [facility];  Service: Gastroenterology;  Laterality: N/A;  COVID POSITIVE 03/21/2021 IN CARE EVERYWHERE   ESOPHAGEAL MANOMETRY N/A 05/20/2020   Procedure: ESOPHAGEAL MANOMETRY (EM);   Surgeon: Shila Gustav GAILS, MD;  Location: [facility];  Service: Endoscopy;  Laterality: N/A;   ESOPHAGOGASTRODUODENOSCOPY (EGD) WITH PROPOFOL  N/A 04/11/2020   Procedure: ESOPHAGOGASTRODUODENOSCOPY (EGD) WITH PROPOFOL ;  Surgeon: Unk Corinn Skiff, MD;  Location: [facility];  Service: Gastroenterology;  Laterality: N/A;   hemmorhoid     HEMORRHOID SURGERY     HERNIA REPAIR         Home Medications    Prior to Admission medications   Medication Sig Start Date End Date Taking? Authorizing Provider  levothyroxine (SYNTHROID, LEVOTHROID) 75 MCG tablet Take 75 mcg by mouth daily before breakfast.   Yes [provider]  linaclotide  (LINZESS ) 145 MCG CAPS capsule TAKE 1 CAPSULE BY MOUTH DAILY BEFORE BREAKFAST. 10/09/20  Yes Vanga, Rohini Reddy, MD  metoprolol succinate (TOPROL-XL) 50 MG 24 hr tablet Take 50 mg by mouth daily. Take with or immediately following a meal.   Yes [provider]  valsartan (DIOVAN) 40 MG tablet Take 40 mg by mouth daily. 01/16/20  Yes [provider]  omeprazole  (PRILOSEC) 40 MG capsule Take 1 capsule (40 mg total) by mouth 2 (two) times daily before a meal. 10/25/20   Vanga, Corinn Skiff, MD  ondansetron  (ZOFRAN ) 4 MG tablet Take 1 tablet (4 mg total) by mouth every 8 (eight) hours as needed for nausea or vomiting. Patient not taking: Reported on 06/06/2020 06/04/20   Vanga, Rohini  Jess, MD  polyethylene glycol (MIRALAX / GLYCOLAX) 17 g packet Take 17 g by mouth daily.    [provider]    Family History Family History  Problem Relation Age of Onset   Diabetes Mother    Cancer Mother    Cancer Father    Thyroid  disease Sister    Cancer Sister    Diabetes Brother    Thyroid  disease Brother     Social History Social History   Tobacco Use   Smoking status: Former    Current packs/day: 0.00    Types: Cigarettes    Quit date: 1994    Years since quitting: 31.7   Smokeless tobacco: Never  Vaping Use   Vaping status:  Never Used  Substance Use Topics   Alcohol use: No   Drug use: No     Allergies   Alpha-gal, Aspirin, and Sulfa antibiotics   Review of Systems Review of Systems  Constitutional:  Negative for activity change, appetite change, fatigue and fever.  Eyes:  Positive for visual disturbance. Negative for photophobia, pain, discharge, redness and itching.  Gastrointestinal:  Negative for nausea and vomiting.  Neurological:  Negative for dizziness, light-headedness and headaches.     Physical Exam Triage Vital Signs ED Triage Vitals  Encounter Vitals Group     BP 12/25/23 0926 (!) 156/78     Girls Systolic BP Percentile --      Girls Diastolic BP Percentile --      Boys Systolic BP Percentile --      Boys Diastolic BP Percentile --      Pulse Rate 12/25/23 0926 67     Resp 12/25/23 0926 18     Temp 12/25/23 0926 98.7 F (37.1 C)     Temp src --      SpO2 12/25/23 0926 97 %     Weight 12/25/23 0924 170 lb (77.1 kg)     Height --      Head Circumference --      Peak Flow --      Pain Score 12/25/23 0924 0     Pain Loc --      Pain Education --      Exclude from Growth Chart --    No data found.  Updated Vital Signs BP (!) 156/78 (BP Location: Right Arm)   Pulse 67   Temp 98.7 F (37.1 C)   Resp 18   Wt 170 lb (77.1 kg)   SpO2 97%   BMI 24.39 kg/m   Visual Acuity Right Eye Distance: 20/40 Left Eye Distance: 20/400 Bilateral Distance:    Right Eye Near:   Left Eye Near:    Bilateral Near:  20/50  Physical Exam Vitals reviewed.  Constitutional:      General: He is awake.     Appearance: Normal appearance. He is well-developed. He is not ill-appearing.     Comments: Very pleasant male appears stated age in no acute distress sitting comfortably in exam room  HENT:     Head: Normocephalic and atraumatic.  Eyes:     Extraocular Movements: Extraocular movements intact.     Conjunctiva/sclera: Conjunctivae normal.     Pupils: Pupils are equal, round, and  reactive to light.     Left eye: No corneal abrasion or fluorescein uptake. Seidel exam negative. Cardiovascular:     Rate and Rhythm: Normal rate and regular rhythm.     Heart sounds: Normal heart sounds, S1 normal and S2 normal. No murmur  heard. Pulmonary:     Effort: Pulmonary effort is normal.     Breath sounds: Normal breath sounds. No stridor. No wheezing, rhonchi or rales.     Comments: Clear to auscultation bilaterally Neurological:     Mental Status: He is alert.  Psychiatric:        Behavior: Behavior is cooperative.      UC Treatments / Results  Labs (all labs ordered are listed, but only abnormal results are displayed) Labs Reviewed - No data to display  EKG   Radiology No results found.  Procedures Procedures (including critical care time)  Medications Ordered in UC Medications - No data to display  Initial Impression / Assessment and Plan / UC Course  I have reviewed the triage vital signs and the nursing notes.  Pertinent labs & imaging results that were available during my care of the patient were reviewed by me and considered in my medical decision making (see chart for details).     Patient is well-appearing, afebrile, nontoxic, nontachycardic.  Concern for retinal detachment given clinical presentation and visual acuity of 20/200 in clinic today.  Contacted ophthalmologist on-call (Dr. Georgia) who will see patient emergently.  Patient is to leave our clinic and go directly to [facility].  He was given the contact information but we discussed that if he has any difficulty he can contact us  so that we can ensure he gets to the right place.  He was agreeable and will go directly to [facility] for further evaluation and management.  Final Clinical Impressions(s) / UC Diagnoses   Final diagnoses:  Sudden visual loss of left eye     Discharge Instructions      Please go see Dr. Georgia at [facility] immediately.  He is  expecting you.  When you get there please call [phone].  If you have any trouble please call our clinic back.     ED Prescriptions   None    PDMP not reviewed this encounter.   Sherrell Rocky POUR, PA-C 12/25/23 9045

## 2023-12-25 NOTE — Discharge Instructions (Signed)
 Please go see Dr. Fiacco at [facility] immediately.  He is expecting you.  When you get there please call [phone].  If you have any trouble please call our clinic back.
